# Patient Record
Sex: Male | Born: 1990 | Race: White | Hispanic: No | Marital: Single | Smoking: Former smoker
Health system: Southern US, Community
[De-identification: ages and names within clinical notes are randomized; demographics above are authoritative.]

## PROBLEM LIST (undated history)

## (undated) DIAGNOSIS — R45851 Suicidal ideations: Secondary | ICD-10-CM

## (undated) DIAGNOSIS — F909 Attention-deficit hyperactivity disorder, unspecified type: Secondary | ICD-10-CM

## (undated) DIAGNOSIS — T50901A Poisoning by unspecified drugs, medicaments and biological substances, accidental (unintentional), initial encounter: Secondary | ICD-10-CM

## (undated) DIAGNOSIS — K589 Irritable bowel syndrome without diarrhea: Secondary | ICD-10-CM

## (undated) DIAGNOSIS — F419 Anxiety disorder, unspecified: Secondary | ICD-10-CM

## (undated) DIAGNOSIS — K219 Gastro-esophageal reflux disease without esophagitis: Secondary | ICD-10-CM

## (undated) DIAGNOSIS — M1990 Unspecified osteoarthritis, unspecified site: Secondary | ICD-10-CM

## (undated) DIAGNOSIS — F319 Bipolar disorder, unspecified: Secondary | ICD-10-CM

## (undated) DIAGNOSIS — F4310 Post-traumatic stress disorder, unspecified: Secondary | ICD-10-CM

## (undated) HISTORY — PX: APPENDECTOMY: SHX54

---
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
## 2010-01-31 ENCOUNTER — Emergency Department: Payer: Self-pay | Admitting: Emergency Medicine

## 2010-02-12 ENCOUNTER — Other Ambulatory Visit: Payer: Self-pay | Admitting: Emergency Medicine

## 2010-02-12 ENCOUNTER — Ambulatory Visit: Payer: Self-pay | Admitting: Psychiatry

## 2010-02-12 ENCOUNTER — Inpatient Hospital Stay (HOSPITAL_COMMUNITY): Admission: RE | Admit: 2010-02-12 | Discharge: 2010-02-19 | Payer: Self-pay | Admitting: Psychiatry

## 2010-02-23 ENCOUNTER — Emergency Department: Payer: Self-pay | Admitting: Emergency Medicine

## 2010-02-28 ENCOUNTER — Emergency Department: Payer: Self-pay | Admitting: Emergency Medicine

## 2010-03-07 ENCOUNTER — Emergency Department (HOSPITAL_COMMUNITY): Admission: EM | Admit: 2010-03-07 | Discharge: 2010-03-07 | Payer: Self-pay | Admitting: Family Medicine

## 2010-03-31 ENCOUNTER — Emergency Department (HOSPITAL_COMMUNITY): Admission: EM | Admit: 2010-03-31 | Discharge: 2010-04-01 | Payer: Self-pay | Admitting: Emergency Medicine

## 2010-04-20 ENCOUNTER — Other Ambulatory Visit: Payer: Self-pay | Admitting: Emergency Medicine

## 2010-04-21 ENCOUNTER — Inpatient Hospital Stay (HOSPITAL_COMMUNITY): Admission: RE | Admit: 2010-04-21 | Discharge: 2010-04-22 | Payer: Self-pay | Admitting: Psychiatry

## 2010-04-21 ENCOUNTER — Ambulatory Visit: Payer: Self-pay | Admitting: Psychiatry

## 2010-04-24 ENCOUNTER — Emergency Department (HOSPITAL_COMMUNITY): Admission: EM | Admit: 2010-04-24 | Discharge: 2010-04-24 | Payer: Self-pay | Admitting: Emergency Medicine

## 2010-04-24 ENCOUNTER — Emergency Department (HOSPITAL_COMMUNITY): Admission: EM | Admit: 2010-04-24 | Discharge: 2010-04-24 | Payer: Self-pay | Admitting: Family Medicine

## 2010-05-14 ENCOUNTER — Emergency Department (HOSPITAL_COMMUNITY): Admission: EM | Admit: 2010-05-14 | Discharge: 2010-05-14 | Payer: Self-pay | Admitting: Family Medicine

## 2010-05-24 ENCOUNTER — Emergency Department (HOSPITAL_BASED_OUTPATIENT_CLINIC_OR_DEPARTMENT_OTHER): Admission: EM | Admit: 2010-05-24 | Discharge: 2010-05-24 | Payer: Self-pay | Admitting: Emergency Medicine

## 2010-08-26 ENCOUNTER — Emergency Department (HOSPITAL_COMMUNITY)
Admission: EM | Admit: 2010-08-26 | Discharge: 2010-08-26 | Payer: Self-pay | Source: Home / Self Care | Admitting: Emergency Medicine

## 2010-08-26 LAB — POCT URINALYSIS DIPSTICK
Bilirubin Urine: NEGATIVE
Hemoglobin, Urine: NEGATIVE
Ketones, ur: NEGATIVE mg/dL
Nitrite: NEGATIVE
Protein, ur: NEGATIVE mg/dL
Specific Gravity, Urine: 1.015 (ref 1.005–1.030)
Urine Glucose, Fasting: NEGATIVE mg/dL
Urobilinogen, UA: 0.2 mg/dL (ref 0.0–1.0)
pH: 7 (ref 5.0–8.0)

## 2010-09-23 ENCOUNTER — Inpatient Hospital Stay (INDEPENDENT_AMBULATORY_CARE_PROVIDER_SITE_OTHER)
Admission: RE | Admit: 2010-09-23 | Discharge: 2010-09-23 | Disposition: A | Discharge status: Home / Self Care | Payer: Medicaid Other | Source: Ambulatory Visit | Attending: Emergency Medicine | Admitting: Emergency Medicine

## 2010-09-23 ENCOUNTER — Other Ambulatory Visit (HOSPITAL_COMMUNITY): Payer: Self-pay | Admitting: Emergency Medicine

## 2010-09-23 ENCOUNTER — Ambulatory Visit (HOSPITAL_COMMUNITY)
Admission: RE | Admit: 2010-09-23 | Discharge: 2010-09-23 | Disposition: A | Discharge status: Home / Self Care | Payer: Medicaid Other | Source: Ambulatory Visit | Attending: Emergency Medicine | Admitting: Emergency Medicine

## 2010-09-23 DIAGNOSIS — S99921A Unspecified injury of right foot, initial encounter: Secondary | ICD-10-CM

## 2010-09-23 DIAGNOSIS — S93609A Unspecified sprain of unspecified foot, initial encounter: Secondary | ICD-10-CM

## 2010-09-23 DIAGNOSIS — M79609 Pain in unspecified limb: Secondary | ICD-10-CM | POA: Insufficient documentation

## 2010-11-04 LAB — DIFFERENTIAL
Basophils Absolute: 0.1 10*3/uL (ref 0.0–0.1)
Basophils Absolute: 0 10*3/uL (ref 0.0–0.1)
Basophils Relative: 1 % (ref 0–1)
Basophils Relative: 0 % (ref 0–1)
Eosinophils Absolute: 0.1 10*3/uL (ref 0.0–0.7)
Eosinophils Absolute: 0 10*3/uL (ref 0.0–0.7)
Eosinophils Relative: 2 % (ref 0–5)
Eosinophils Relative: 0 % (ref 0–5)
Lymphocytes Relative: 41 % (ref 12–46)
Lymphocytes Relative: 8 % — ABNORMAL LOW (ref 12–46)
Lymphs Abs: 2.3 10*3/uL (ref 0.7–4.0)
Lymphs Abs: 0.8 10*3/uL (ref 0.7–4.0)
Monocytes Absolute: 0.5 10*3/uL (ref 0.1–1.0)
Monocytes Absolute: 0.7 10*3/uL (ref 0.1–1.0)
Monocytes Relative: 9 % (ref 3–12)
Monocytes Relative: 8 % (ref 3–12)
Neutro Abs: 2.7 10*3/uL (ref 1.7–7.7)
Neutro Abs: 8.1 10*3/uL — ABNORMAL HIGH (ref 1.7–7.7)
Neutrophils Relative %: 47 % (ref 43–77)
Neutrophils Relative %: 84 % — ABNORMAL HIGH (ref 43–77)

## 2010-11-04 LAB — BASIC METABOLIC PANEL
BUN: 10 mg/dL (ref 6–23)
BUN: 12 mg/dL (ref 6–23)
CO2: 30 mEq/L (ref 19–32)
CO2: 26 mEq/L (ref 19–32)
Calcium: 9.2 mg/dL (ref 8.4–10.5)
Calcium: 8.7 mg/dL (ref 8.4–10.5)
Chloride: 106 mEq/L (ref 96–112)
Chloride: 101 mEq/L (ref 96–112)
Creatinine, Ser: 0.8 mg/dL (ref 0.4–1.5)
Creatinine, Ser: 0.72 mg/dL (ref 0.4–1.5)
GFR calc Af Amer: >60 mL/min (ref >60)
GFR calc Af Amer: >60 mL/min (ref >60)
GFR calc non Af Amer: >60 mL/min (ref >60)
GFR calc non Af Amer: >60 mL/min (ref >60)
Glucose, Bld: 94 mg/dL (ref 70–99)
Glucose, Bld: 106 mg/dL — ABNORMAL HIGH (ref 70–99)
Potassium: 4.3 mEq/L (ref 3.5–5.1)
Potassium: 3.8 mEq/L (ref 3.5–5.1)
Sodium: 142 mEq/L (ref 135–145)
Sodium: 134 mEq/L — ABNORMAL LOW (ref 135–145)

## 2010-11-04 LAB — CBC
HCT: 37.2 % — ABNORMAL LOW (ref 39.0–52.0)
HCT: 36.8 % — ABNORMAL LOW (ref 39.0–52.0)
Hemoglobin: 13.1 g/dL (ref 13.0–17.0)
Hemoglobin: 13 g/dL (ref 13.0–17.0)
MCH: 29.8 pg (ref 26.0–34.0)
MCH: 30.5 pg (ref 26.0–34.0)
MCHC: 35.2 g/dL (ref 30.0–36.0)
MCHC: 35.4 g/dL (ref 30.0–36.0)
MCV: 84.7 fL (ref 78.0–100.0)
MCV: 86.1 fL (ref 78.0–100.0)
Platelets: 223 10*3/uL (ref 150–400)
Platelets: 179 10*3/uL (ref 150–400)
RBC: 4.39 MIL/uL (ref 4.22–5.81)
RBC: 4.27 MIL/uL (ref 4.22–5.81)
RDW: 13 % (ref 11.5–15.5)
RDW: 13.3 % (ref 11.5–15.5)
WBC: 5.7 10*3/uL (ref 4.0–10.5)
WBC: 9.6 10*3/uL (ref 4.0–10.5)

## 2010-11-04 LAB — POCT TOXICOLOGY PANEL: Amphetamines: POSITIVE

## 2010-11-04 LAB — VALPROIC ACID LEVEL: Valproic Acid Lvl: 37.8 ug/mL — ABNORMAL LOW (ref 50.0–100.0)

## 2010-11-04 LAB — MONONUCLEOSIS SCREEN: Mono Screen: NEGATIVE

## 2010-11-04 LAB — TSH: TSH: 4.5 u[IU]/mL (ref 0.350–4.500)

## 2010-11-04 LAB — POCT RAPID STREP A (OFFICE): Streptococcus, Group A Screen (Direct): NEGATIVE

## 2010-11-04 LAB — ETHANOL: Alcohol, Ethyl (B): <10 mg/dL (ref 0–10)

## 2010-11-05 LAB — CBC
HCT: 36.7 % — ABNORMAL LOW (ref 39.0–52.0)
HCT: 38.5 % — ABNORMAL LOW (ref 39.0–52.0)
Hemoglobin: 12.6 g/dL — ABNORMAL LOW (ref 13.0–17.0)
Hemoglobin: 13.2 g/dL (ref 13.0–17.0)
MCH: 30 pg (ref 26.0–34.0)
MCH: 30.4 pg (ref 26.0–34.0)
MCHC: 34.5 g/dL (ref 30.0–36.0)
MCHC: 34.2 g/dL (ref 30.0–36.0)
MCV: 87.1 fL (ref 78.0–100.0)
MCV: 88.7 fL (ref 78.0–100.0)
Platelets: 212 10*3/uL (ref 150–400)
Platelets: 163 10*3/uL (ref 150–400)
RBC: 4.21 MIL/uL — ABNORMAL LOW (ref 4.22–5.81)
RBC: 4.34 MIL/uL (ref 4.22–5.81)
RDW: 12.8 % (ref 11.5–15.5)
RDW: 12.3 % (ref 11.5–15.5)
WBC: 6 10*3/uL (ref 4.0–10.5)
WBC: 6 10*3/uL (ref 4.0–10.5)

## 2010-11-05 LAB — DIFFERENTIAL
Basophils Absolute: 0 10*3/uL (ref 0.0–0.1)
Basophils Absolute: 0 10*3/uL (ref 0.0–0.1)
Basophils Relative: 0 % (ref 0–1)
Basophils Relative: 0 % (ref 0–1)
Eosinophils Absolute: 0.1 10*3/uL (ref 0.0–0.7)
Eosinophils Absolute: 0.2 10*3/uL (ref 0.0–0.7)
Eosinophils Relative: 2 % (ref 0–5)
Eosinophils Relative: 4 % (ref 0–5)
Lymphocytes Relative: 44 % (ref 12–46)
Lymphocytes Relative: 36 % (ref 12–46)
Lymphs Abs: 2.6 10*3/uL (ref 0.7–4.0)
Lymphs Abs: 2.2 10*3/uL (ref 0.7–4.0)
Monocytes Absolute: 0.5 10*3/uL (ref 0.1–1.0)
Monocytes Absolute: 0.5 10*3/uL (ref 0.1–1.0)
Monocytes Relative: 8 % (ref 3–12)
Monocytes Relative: 8 % (ref 3–12)
Neutro Abs: 2.8 10*3/uL (ref 1.7–7.7)
Neutro Abs: 3.1 10*3/uL (ref 1.7–7.7)
Neutrophils Relative %: 46 % (ref 43–77)
Neutrophils Relative %: 51 % (ref 43–77)

## 2010-11-05 LAB — COMPREHENSIVE METABOLIC PANEL
ALT: 9 U/L (ref 0–53)
AST: 15 U/L (ref 0–37)
Albumin: 3.7 g/dL (ref 3.5–5.2)
Alkaline Phosphatase: 93 U/L (ref 39–117)
BUN: 13 mg/dL (ref 6–23)
CO2: 28 mEq/L (ref 19–32)
Calcium: 9.1 mg/dL (ref 8.4–10.5)
Chloride: 107 mEq/L (ref 96–112)
Creatinine, Ser: 0.6 mg/dL (ref 0.4–1.5)
GFR calc Af Amer: >60 mL/min (ref >60)
GFR calc non Af Amer: >60 mL/min (ref >60)
Glucose, Bld: 107 mg/dL — ABNORMAL HIGH (ref 70–99)
Potassium: 4 mEq/L (ref 3.5–5.1)
Sodium: 139 mEq/L (ref 135–145)
Total Bilirubin: 0.6 mg/dL (ref 0.3–1.2)
Total Protein: 6.5 g/dL (ref 6.0–8.3)

## 2010-11-05 LAB — URINALYSIS, ROUTINE W REFLEX MICROSCOPIC
Bilirubin Urine: NEGATIVE
Bilirubin Urine: NEGATIVE
Glucose, UA: NEGATIVE mg/dL
Glucose, UA: NEGATIVE mg/dL
Hgb urine dipstick: NEGATIVE
Hgb urine dipstick: NEGATIVE
Ketones, ur: NEGATIVE mg/dL
Ketones, ur: NEGATIVE mg/dL
Nitrite: NEGATIVE
Nitrite: NEGATIVE
Protein, ur: NEGATIVE mg/dL
Protein, ur: NEGATIVE mg/dL
Specific Gravity, Urine: 1.02 (ref 1.005–1.030)
Specific Gravity, Urine: 1.019 (ref 1.005–1.030)
Urobilinogen, UA: 1 mg/dL (ref 0.0–1.0)
Urobilinogen, UA: 0.2 mg/dL (ref 0.0–1.0)
pH: 7 (ref 5.0–8.0)
pH: 6.5 (ref 5.0–8.0)

## 2010-11-05 LAB — BASIC METABOLIC PANEL
BUN: 14 mg/dL (ref 6–23)
CO2: 29 mEq/L (ref 19–32)
Calcium: 9 mg/dL (ref 8.4–10.5)
Chloride: 106 mEq/L (ref 96–112)
Creatinine, Ser: 0.7 mg/dL (ref 0.4–1.5)
GFR calc Af Amer: >60 mL/min (ref >60)
GFR calc non Af Amer: >60 mL/min (ref >60)
Glucose, Bld: 96 mg/dL (ref 70–99)
Potassium: 4.2 mEq/L (ref 3.5–5.1)
Sodium: 142 mEq/L (ref 135–145)

## 2010-11-05 LAB — VALPROIC ACID LEVEL
Valproic Acid Lvl: 50.4 ug/mL (ref 50.0–100.0)
Valproic Acid Lvl: 67.6 ug/mL (ref 50.0–100.0)

## 2010-11-05 LAB — RAPID URINE DRUG SCREEN, HOSP PERFORMED
Amphetamines: NOT DETECTED
Barbiturates: NOT DETECTED
Benzodiazepines: NOT DETECTED
Cocaine: NOT DETECTED
Opiates: NOT DETECTED
Tetrahydrocannabinol: NOT DETECTED

## 2010-11-05 LAB — AMMONIA: Ammonia: 35 umol/L (ref 11–35)

## 2010-11-05 LAB — ETHANOL: Alcohol, Ethyl (B): <5 mg/dL (ref 0–10)

## 2010-11-05 LAB — ACETAMINOPHEN LEVEL: Acetaminophen (Tylenol), Serum: <10 ug/mL — ABNORMAL LOW (ref 10–30)

## 2010-11-07 LAB — DIFFERENTIAL
Basophils Absolute: 0 10*3/uL (ref 0.0–0.1)
Basophils Relative: 1 % (ref 0–1)
Eosinophils Absolute: 0.1 10*3/uL (ref 0.0–0.7)
Eosinophils Relative: 2 % (ref 0–5)
Lymphocytes Relative: 43 % (ref 12–46)
Lymphs Abs: 2.1 10*3/uL (ref 0.7–4.0)
Monocytes Absolute: 0.2 10*3/uL (ref 0.1–1.0)
Monocytes Relative: 5 % (ref 3–12)
Neutro Abs: 2.4 10*3/uL (ref 1.7–7.7)
Neutrophils Relative %: 49 % (ref 43–77)

## 2010-11-07 LAB — COMPREHENSIVE METABOLIC PANEL
ALT: 23 U/L (ref 0–53)
AST: 21 U/L (ref 0–37)
Albumin: 4.2 g/dL (ref 3.5–5.2)
Alkaline Phosphatase: 89 U/L (ref 39–117)
BUN: 14 mg/dL (ref 6–23)
CO2: 31 mEq/L (ref 19–32)
Calcium: 8.9 mg/dL (ref 8.4–10.5)
Chloride: 101 mEq/L (ref 96–112)
Creatinine, Ser: 0.69 mg/dL (ref 0.4–1.5)
GFR calc Af Amer: >60 mL/min (ref >60)
GFR calc non Af Amer: >60 mL/min (ref >60)
Glucose, Bld: 159 mg/dL — ABNORMAL HIGH (ref 70–99)
Potassium: 3.7 mEq/L (ref 3.5–5.1)
Sodium: 137 mEq/L (ref 135–145)
Total Bilirubin: 0.6 mg/dL (ref 0.3–1.2)
Total Protein: 7.1 g/dL (ref 6.0–8.3)

## 2010-11-07 LAB — CBC
HCT: 38.7 % — ABNORMAL LOW (ref 39.0–52.0)
Hemoglobin: 12.9 g/dL — ABNORMAL LOW (ref 13.0–17.0)
MCH: 31.2 pg (ref 26.0–34.0)
MCHC: 33.3 g/dL (ref 30.0–36.0)
MCV: 93.6 fL (ref 78.0–100.0)
Platelets: 239 10*3/uL (ref 150–400)
RBC: 4.14 MIL/uL — ABNORMAL LOW (ref 4.22–5.81)
RDW: 11.6 % (ref 11.5–15.5)
WBC: 4.8 10*3/uL (ref 4.0–10.5)

## 2010-11-07 LAB — RPR: RPR Ser Ql: NONREACTIVE

## 2010-11-07 LAB — VALPROIC ACID LEVEL: Valproic Acid Lvl: 58.4 ug/mL (ref 50.0–100.0)

## 2010-11-07 LAB — CARBAMAZEPINE LEVEL, TOTAL: Carbamazepine Lvl: 6.5 ug/mL (ref 4.0–12.0)

## 2010-11-07 LAB — URINALYSIS, ROUTINE W REFLEX MICROSCOPIC
Bilirubin Urine: NEGATIVE
Glucose, UA: NEGATIVE mg/dL
Hgb urine dipstick: NEGATIVE
Ketones, ur: NEGATIVE mg/dL
Nitrite: NEGATIVE
Protein, ur: NEGATIVE mg/dL
Specific Gravity, Urine: 1.013 (ref 1.005–1.030)
Urobilinogen, UA: 0.2 mg/dL (ref 0.0–1.0)
pH: 6.5 (ref 5.0–8.0)

## 2010-11-07 LAB — RAPID URINE DRUG SCREEN, HOSP PERFORMED
Amphetamines: NOT DETECTED
Barbiturates: NOT DETECTED
Benzodiazepines: NOT DETECTED
Cocaine: NOT DETECTED
Opiates: NOT DETECTED
Tetrahydrocannabinol: NOT DETECTED

## 2010-11-07 LAB — HIV ANTIBODY (ROUTINE TESTING W REFLEX): HIV: NONREACTIVE

## 2010-11-07 LAB — ETHANOL: Alcohol, Ethyl (B): <5 mg/dL (ref 0–10)

## 2010-12-02 LAB — GC/CHLAMYDIA PROBE AMP, URINE
Chlamydia, Swab/Urine, PCR: NEGATIVE
GC Probe Amp, Urine: NEGATIVE

## 2010-12-02 LAB — DRUGS OF ABUSE SCREEN W/O ALC, ROUTINE URINE
Amphetamine Screen, Ur: POSITIVE — AB
Barbiturate Quant, Ur: NEGATIVE
Benzodiazepines.: NEGATIVE
Cocaine Metabolites: NEGATIVE
Creatinine,U: 158.3 mg/dL
Marijuana Metabolite: NEGATIVE
Methadone: NEGATIVE
Opiate Screen, Urine: NEGATIVE
Phencyclidine (PCP): NEGATIVE
Propoxyphene: NEGATIVE

## 2010-12-02 LAB — CBC
HCT: 43.1 % (ref 36.0–49.0)
Hemoglobin: 14.5 g/dL (ref 12.0–16.0)
MCHC: 33.7 g/dL (ref 31.0–37.0)
MCV: 91 fL (ref 78.0–98.0)
Platelets: 175 10*3/uL (ref 150–400)
RBC: 4.74 MIL/uL (ref 3.80–5.70)
RDW: 12.6 % (ref 11.4–15.5)
WBC: 6.3 10*3/uL (ref 4.5–13.5)

## 2010-12-02 LAB — THYROID ANTIBODIES
Thyroglobulin Ab: <30 U/mL (ref 0.0–60.0)
Thyroperoxidase Ab SerPl-aCnc: 35.2 U/mL (ref 0.0–60.0)

## 2010-12-02 LAB — URINALYSIS, ROUTINE W REFLEX MICROSCOPIC
Bilirubin Urine: NEGATIVE
Glucose, UA: NEGATIVE mg/dL
Hgb urine dipstick: NEGATIVE
Ketones, ur: NEGATIVE mg/dL
Nitrite: NEGATIVE
Protein, ur: NEGATIVE mg/dL
Specific Gravity, Urine: 1.022 (ref 1.005–1.030)
Urobilinogen, UA: 1 mg/dL (ref 0.0–1.0)
pH: 6.5 (ref 5.0–8.0)

## 2010-12-02 LAB — DIFFERENTIAL
Basophils Absolute: 0 10*3/uL (ref 0.0–0.1)
Basophils Relative: 0 % (ref 0–1)
Eosinophils Absolute: 0.1 10*3/uL (ref 0.0–1.2)
Eosinophils Relative: 2 % (ref 0–5)
Lymphocytes Relative: 43 % (ref 24–48)
Lymphs Abs: 2.7 10*3/uL (ref 1.1–4.8)
Monocytes Absolute: 0.5 10*3/uL (ref 0.2–1.2)
Monocytes Relative: 7 % (ref 3–11)
Neutro Abs: 3 10*3/uL (ref 1.7–8.0)
Neutrophils Relative %: 47 % (ref 43–71)

## 2010-12-02 LAB — LIPID PANEL
Cholesterol: 123 mg/dL (ref 0–169)
HDL: 34 mg/dL — ABNORMAL LOW (ref >34)
LDL Cholesterol: 77 mg/dL (ref 0–109)
Total CHOL/HDL Ratio: 3.6 RATIO
Triglycerides: 58 mg/dL (ref <150)
VLDL: 12 mg/dL (ref 0–40)

## 2010-12-02 LAB — AMPHETAMINES URINE CONFIRMATION
Amphetamines: 6600 ng/mL
Methamphetamine GC/MS, Ur: NEGATIVE
Methylenedioxyamphetamine: NEGATIVE
Methylenedioxyethylamphetamine: NEGATIVE
Methylenedioxymethamphetamine: NEGATIVE

## 2010-12-02 LAB — BASIC METABOLIC PANEL
BUN: 13 mg/dL (ref 6–23)
CO2: 30 mEq/L (ref 19–32)
Calcium: 8.7 mg/dL (ref 8.4–10.5)
Chloride: 99 mEq/L (ref 96–112)
Creatinine, Ser: 0.76 mg/dL (ref 0.4–1.5)
Glucose, Bld: 94 mg/dL (ref 70–99)
Potassium: 4.1 mEq/L (ref 3.5–5.1)
Sodium: 141 mEq/L (ref 135–145)

## 2010-12-02 LAB — HEMOGLOBIN A1C
Hgb A1c MFr Bld: 5.1 % (ref 4.6–6.1)
Mean Plasma Glucose: 100 mg/dL

## 2010-12-02 LAB — HEPATIC FUNCTION PANEL
ALT: 12 U/L (ref 0–53)
AST: 15 U/L (ref 0–37)
Albumin: 3.5 g/dL (ref 3.5–5.2)
Alkaline Phosphatase: 118 U/L (ref 52–171)
Bilirubin, Direct: 0.1 mg/dL (ref 0.0–0.3)
Indirect Bilirubin: 0.5 mg/dL (ref 0.3–0.9)
Total Bilirubin: 0.6 mg/dL (ref 0.3–1.2)
Total Protein: 6.5 g/dL (ref 6.0–8.3)

## 2010-12-02 LAB — VALPROIC ACID LEVEL: Valproic Acid Lvl: 102.3 ug/mL — ABNORMAL HIGH (ref 50.0–100.0)

## 2010-12-02 LAB — GAMMA GT: GGT: 16 U/L (ref 7–51)

## 2010-12-02 LAB — T4, FREE: Free T4: 0.89 ng/dL (ref 0.89–1.80)

## 2010-12-02 LAB — T3, FREE: T3, Free: 3.5 pg/mL (ref 2.3–4.2)

## 2010-12-02 LAB — TSH: TSH: 6.667 u[IU]/mL — ABNORMAL HIGH (ref 0.350–4.500)

## 2010-12-02 LAB — RPR: RPR Ser Ql: NONREACTIVE

## 2010-12-16 ENCOUNTER — Inpatient Hospital Stay (INDEPENDENT_AMBULATORY_CARE_PROVIDER_SITE_OTHER)
Admission: RE | Admit: 2010-12-16 | Discharge: 2010-12-16 | Disposition: A | Discharge status: Home / Self Care | Payer: Medicaid Other | Source: Ambulatory Visit | Attending: Family Medicine | Admitting: Family Medicine

## 2010-12-16 ENCOUNTER — Ambulatory Visit (INDEPENDENT_AMBULATORY_CARE_PROVIDER_SITE_OTHER): Payer: Medicaid Other

## 2010-12-16 DIAGNOSIS — S5010XA Contusion of unspecified forearm, initial encounter: Secondary | ICD-10-CM
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
## 2011-01-11 ENCOUNTER — Inpatient Hospital Stay (INDEPENDENT_AMBULATORY_CARE_PROVIDER_SITE_OTHER)
Admission: RE | Admit: 2011-01-11 | Discharge: 2011-01-11 | Disposition: A | Discharge status: Home / Self Care | Payer: Self-pay | Source: Ambulatory Visit | Attending: Family Medicine | Admitting: Family Medicine

## 2011-01-11 DIAGNOSIS — L723 Sebaceous cyst: Secondary | ICD-10-CM

## 2011-05-16 LAB — URINALYSIS, ROUTINE W REFLEX MICROSCOPIC
Bilirubin Urine: NEGATIVE
Glucose, UA: NEGATIVE
Hgb urine dipstick: NEGATIVE
Ketones, ur: NEGATIVE
Nitrite: NEGATIVE
Protein, ur: NEGATIVE
Specific Gravity, Urine: 1.025
Urobilinogen, UA: 0.2
pH: 7

## 2011-05-16 LAB — DRUGS OF ABUSE SCREEN W/O ALC, ROUTINE URINE
Amphetamine Screen, Ur: POSITIVE — AB
Barbiturate Quant, Ur: NEGATIVE
Benzodiazepines.: NEGATIVE
Cocaine Metabolites: NEGATIVE
Creatinine,U: 117.8
Marijuana Metabolite: NEGATIVE
Methadone: NEGATIVE
Opiate Screen, Urine: NEGATIVE
Phencyclidine (PCP): NEGATIVE
Propoxyphene: NEGATIVE

## 2011-05-16 LAB — CBC
HCT: 38.5
Hemoglobin: 13.6
MCHC: 35.5
MCV: 81.8
Platelets: 251
RBC: 4.7
RDW: 14.1
WBC: 6

## 2011-05-16 LAB — VALPROIC ACID LEVEL: Valproic Acid Lvl: 64.8

## 2011-05-16 LAB — DIFFERENTIAL
Basophils Absolute: 0
Basophils Relative: 0
Eosinophils Absolute: 0.2
Eosinophils Relative: 3
Lymphocytes Relative: 40
Lymphs Abs: 2.4
Monocytes Absolute: 0.4
Monocytes Relative: 7
Neutro Abs: 3
Neutrophils Relative %: 50

## 2011-05-16 LAB — HEPATIC FUNCTION PANEL
ALT: 19
AST: 21
Albumin: 3.5
Alkaline Phosphatase: 220 — ABNORMAL HIGH
Bilirubin, Direct: 0.1
Indirect Bilirubin: 0.6
Total Bilirubin: 0.7
Total Protein: 6.3

## 2011-05-16 LAB — LIPID PANEL
Cholesterol: 124
HDL: 31 — ABNORMAL LOW
LDL Cholesterol: 74
Total CHOL/HDL Ratio: 4
Triglycerides: 94
VLDL: 19

## 2011-05-16 LAB — BASIC METABOLIC PANEL
BUN: 11
CO2: 32
Calcium: 9
Chloride: 102
Creatinine, Ser: 0.76
Glucose, Bld: 96
Potassium: 4.4
Sodium: 140

## 2011-05-16 LAB — GAMMA GT: GGT: 26

## 2011-05-16 LAB — AMPHETAMINES URINE CONFIRMATION
Amphetamines: 6300 ng/mL
Methamphetamine GC/MS, Ur: NEGATIVE
Methylenedioxyamphetamine: NEGATIVE
Methylenedioxyethylamphetamine: NEGATIVE
Methylenedioxymethamphetamine: NEGATIVE

## 2011-05-16 LAB — TSH: TSH: 2.49

## 2011-05-16 LAB — T4, FREE: Free T4: 1.26

## 2011-05-16 LAB — HEMOGLOBIN A1C
Hgb A1c MFr Bld: 5.5
Mean Plasma Glucose: 119

## 2011-05-16 LAB — GC/CHLAMYDIA PROBE AMP, URINE
Chlamydia, Swab/Urine, PCR: NEGATIVE
GC Probe Amp, Urine: NEGATIVE

## 2011-05-18 LAB — OCCULT BLOOD X 1 CARD TO LAB, STOOL: Fecal Occult Bld: POSITIVE

## 2011-05-20 LAB — AMPHETAMINES URINE CONFIRMATION
Amphetamines: 4000 ng/mL
Methamphetamine GC/MS, Ur: NEGATIVE
Methylenedioxyamphetamine: NEGATIVE
Methylenedioxyethylamphetamine: NEGATIVE
Methylenedioxymethamphetamine: NEGATIVE

## 2011-05-20 LAB — DRUGS OF ABUSE SCREEN W/O ALC, ROUTINE URINE
Amphetamine Screen, Ur: POSITIVE — AB
Barbiturate Quant, Ur: NEGATIVE
Benzodiazepines.: NEGATIVE
Cocaine Metabolites: NEGATIVE
Creatinine,U: 194.5
Marijuana Metabolite: NEGATIVE
Methadone: NEGATIVE
Opiate Screen, Urine: NEGATIVE
Phencyclidine (PCP): NEGATIVE
Propoxyphene: NEGATIVE

## 2011-05-20 LAB — LIPID PANEL
Cholesterol: 115
HDL: 26 — ABNORMAL LOW
LDL Cholesterol: 48
Total CHOL/HDL Ratio: 4.4
Triglycerides: 203 — ABNORMAL HIGH
VLDL: 41 — ABNORMAL HIGH

## 2011-05-20 LAB — DIFFERENTIAL
Basophils Absolute: 0
Basophils Relative: 0
Eosinophils Absolute: 0.1
Eosinophils Relative: 2
Lymphocytes Relative: 39
Lymphs Abs: 2.4
Monocytes Absolute: 0.5
Monocytes Relative: 8
Neutro Abs: 3.1
Neutrophils Relative %: 51

## 2011-05-20 LAB — CBC
HCT: 39.1
Hemoglobin: 13.3
MCHC: 34.1
MCV: 83.4
Platelets: 211
RBC: 4.69
RDW: 13.8
WBC: 6.1

## 2011-05-20 LAB — COMPREHENSIVE METABOLIC PANEL
ALT: 14
AST: 16
Albumin: 3.3 — ABNORMAL LOW
Alkaline Phosphatase: 159
BUN: 9
CO2: 32
Calcium: 9
Chloride: 104
Creatinine, Ser: 0.74
Glucose, Bld: 100 — ABNORMAL HIGH
Potassium: 4.4
Sodium: 143
Total Bilirubin: 0.5
Total Protein: 5.9 — ABNORMAL LOW

## 2011-05-20 LAB — URINALYSIS, ROUTINE W REFLEX MICROSCOPIC
Bilirubin Urine: NEGATIVE
Glucose, UA: NEGATIVE
Hgb urine dipstick: NEGATIVE
Ketones, ur: NEGATIVE
Nitrite: NEGATIVE
Protein, ur: NEGATIVE
Specific Gravity, Urine: 1.027
Urobilinogen, UA: 0.2
pH: 7

## 2011-05-20 LAB — TSH: TSH: 2.746

## 2011-05-20 LAB — VALPROIC ACID LEVEL: Valproic Acid Lvl: 62.7

## 2011-05-20 LAB — HEMOGLOBIN A1C
Hgb A1c MFr Bld: 5.5
Mean Plasma Glucose: 119

## 2011-05-23 LAB — HIV ANTIBODY (ROUTINE TESTING W REFLEX): HIV: NONREACTIVE

## 2011-05-23 LAB — RPR: RPR Ser Ql: NONREACTIVE

## 2011-09-02 ENCOUNTER — Emergency Department (HOSPITAL_COMMUNITY)
Admission: EM | Admit: 2011-09-02 | Discharge: 2011-09-02 | Disposition: A | Discharge status: Home / Self Care | Payer: Medicaid Other | Attending: Emergency Medicine | Admitting: Emergency Medicine

## 2011-09-02 DIAGNOSIS — R51 Headache: Secondary | ICD-10-CM | POA: Insufficient documentation

## 2011-09-02 DIAGNOSIS — J069 Acute upper respiratory infection, unspecified: Secondary | ICD-10-CM | POA: Insufficient documentation

## 2011-09-02 DIAGNOSIS — R059 Cough, unspecified: Secondary | ICD-10-CM | POA: Insufficient documentation

## 2011-09-02 DIAGNOSIS — R05 Cough: Secondary | ICD-10-CM | POA: Insufficient documentation

## 2011-09-02 DIAGNOSIS — H6590 Unspecified nonsuppurative otitis media, unspecified ear: Secondary | ICD-10-CM | POA: Insufficient documentation

## 2011-09-02 DIAGNOSIS — R42 Dizziness and giddiness: Secondary | ICD-10-CM | POA: Insufficient documentation

## 2011-09-02 MED ORDER — ACETAMINOPHEN 325 MG PO TABS
650.0000 mg | ORAL_TABLET | Freq: Once | ORAL | Status: AC
  Administered 2011-09-02: 650 mg via ORAL
  Filled 2011-09-02: qty 2

## 2011-09-02 NOTE — ED Provider Notes (Signed)
History     CSN: [id number]  Arrival date & time 09/02/11  1746   First MD Initiated Contact with Patient 09/02/11 1919      Chief Complaint  Patient presents with  . Headache    x 1 month, motrin 800mg  will help for a few hours.   . Otalgia    left side x 1 week.  no drainage  . Dizziness    since yesterday    (Consider location/radiation/quality/duration/timing/severity/associated sxs/prior treatment) Patient is a 21 y.o. male presenting with headaches and ear pain. The history is provided by the patient.  Headache  This is a new problem. The current episode started more than 1 week ago. Episode frequency: every few days. The problem has not changed since onset.The headache is associated with nothing. The pain is located in the frontal region. The quality of the pain is described as dull. The pain is moderate. The pain does not radiate. Pertinent negatives include no fever, no syncope, no shortness of breath, no nausea and no vomiting. He has tried NSAIDs for the symptoms. The treatment provided moderate relief.  Otalgia This is a new problem. The current episode started more than 2 days ago. There is pain in the left ear. The problem occurs constantly. The problem has been gradually worsening. There has been no fever. The pain is moderate. Associated symptoms include headaches, sore throat and cough. Pertinent negatives include no ear discharge, no hearing loss, no rhinorrhea, no abdominal pain, no vomiting, no neck pain and no rash.  Bilateral ear "fullness" with pain to left ear. Also with intermittent dizziness feeling "foggy" x 1 day assoc with ear fullness. Pt reports he has a prescription for flonase from PCP but has not been able to afford it recently.  No past medical history on file.  No past surgical history on file.  No family history on file.  History  Substance Use Topics  . Smoking status: Not on file  . Smokeless tobacco: Not on file  . Alcohol Use: Not on file        Review of Systems  Constitutional: Positive for chills. Negative for fever, appetite change and fatigue.  HENT: Positive for ear pain, congestion, sore throat and sinus pressure. Negative for hearing loss, rhinorrhea, trouble swallowing, neck pain, neck stiffness and ear discharge.   Eyes: Negative for pain, discharge and visual disturbance.  Respiratory: Positive for cough. Negative for chest tightness and shortness of breath.   Cardiovascular: Negative for chest pain and syncope.  Gastrointestinal: Negative for nausea, vomiting and abdominal pain.  Genitourinary: Negative for dysuria and hematuria.  Musculoskeletal: Negative for back pain, joint swelling and gait problem.  Skin: Negative for rash and wound.  Neurological: Positive for dizziness and headaches. Negative for syncope, speech difficulty, weakness, light-headedness and numbness.  Psychiatric/Behavioral: Negative for confusion.    Allergies  Geodon  Home Medications   Current Outpatient Rx  Name Route Sig Dispense Refill  . BUPROPION HCL ER (XL) 150 MG PO TB24 Oral Take 150 mg by mouth every morning.    Marland Kitchen DIVALPROEX SODIUM ER 500 MG PO TB24 Oral Take 1,000 mg by mouth 2 (two) times daily. Pt takes 1000 mg in the morning,1000 mg at bedtime    . GUANFACINE HCL ER 2 MG PO TB24 Oral Take 2 mg by mouth every morning.    . IBUPROFEN 800 MG PO TABS Oral Take 800 mg by mouth 3 (three) times daily as needed. pain    . LEVOTHYROXINE  SODIUM 50 MCG PO TABS Oral Take 50 mcg by mouth daily.    Marland Kitchen LISDEXAMFETAMINE DIMESYLATE 50 MG PO CAPS Oral Take 50 mg by mouth every morning.    Marland Kitchen MINOCYCLINE HCL 100 MG PO CAPS Oral Take 100 mg by mouth 2 (two) times daily. Takes cont.    . QUETIAPINE FUMARATE ER 150 MG PO TB24 Oral Take 150 mg by mouth See admin instructions. Pt takes on weekends.    Marland Kitchen RANITIDINE HCL 150 MG PO TABS Oral Take 150 mg by mouth daily.    Marland Kitchen RISPERIDONE MICROSPHERES 37.5 MG IM SUSR Intramuscular Inject 37.5 mg into  the muscle every 14 (fourteen) days. Pt get this injection at [facility]. Pt's last injection was on 09-02-11      BP 119/74  Pulse 100  Temp(Src) 98.1 F (36.7 C) (Oral)  Resp 20  SpO2 97%  Physical Exam  Constitutional: He is oriented to person, place, and time. He appears well-developed and well-nourished. No distress.  HENT:  Head: Normocephalic and atraumatic.  Right Ear: Hearing, external ear and ear canal normal. No drainage or swelling. No mastoid tenderness. Tympanic membrane is not injected, not perforated, not erythematous and not bulging.  Left Ear: External ear and ear canal normal. No drainage or swelling. No mastoid tenderness. Tympanic membrane is not injected, not perforated, not erythematous and not bulging. A middle ear effusion is present.  Nose: Nose normal.  Mouth/Throat: Uvula is midline and mucous membranes are normal. Posterior oropharyngeal erythema present. No oropharyngeal exudate or posterior oropharyngeal edema.       Hearing intact to spoken voice. Left sided decreased hearing to finger rub.  Eyes: Conjunctivae and EOM are normal. Pupils are equal, round, and reactive to light. Right eye exhibits no discharge. Left eye exhibits no discharge.  Neck: Normal range of motion. Neck supple.  Cardiovascular: Normal rate, regular rhythm and normal heart sounds.   Pulmonary/Chest: Effort normal and breath sounds normal. No respiratory distress. He has no wheezes. He exhibits no tenderness.  Abdominal: Soft. Bowel sounds are normal. He exhibits no distension. There is no tenderness.  Musculoskeletal: Normal range of motion. He exhibits no edema and no tenderness.  Lymphadenopathy:    He has no cervical adenopathy.  Neurological: He is alert and oriented to person, place, and time. No cranial nerve deficit. Coordination normal.       Gait normal. F-N itnact bilaterally. Sensation intact to light touch, GCS 15.  Skin: Skin is warm and dry. No rash noted.    Psychiatric: His behavior is normal.    ED Course  Procedures (including critical care time)  Labs Reviewed - No data to display No results found.      MDM   1: URI 2: Left ear effusion  Advised use of anti-histamine and decongestant for symptoms, which will likely improve ear fullness and effusion, as well as HA.       Elwyn Reach [place], [place] 09/02/11 2014

## 2011-09-03 NOTE — ED Provider Notes (Signed)
Medical screening examination/treatment/procedure(s) were performed by non-physician practitioner and as supervising physician I was immediately available for consultation/collaboration.  Doug Sou, MD 09/03/11 [phone]

## 2012-01-07 ENCOUNTER — Emergency Department (HOSPITAL_COMMUNITY)
Admission: EM | Admit: 2012-01-07 | Discharge: 2012-01-08 | Disposition: A | Discharge status: Home / Self Care | Payer: Medicaid Other | Attending: Emergency Medicine | Admitting: Emergency Medicine

## 2012-01-07 ENCOUNTER — Encounter (HOSPITAL_COMMUNITY): Payer: Self-pay | Admitting: *Deleted

## 2012-01-07 DIAGNOSIS — F319 Bipolar disorder, unspecified: Secondary | ICD-10-CM | POA: Insufficient documentation

## 2012-01-07 DIAGNOSIS — R002 Palpitations: Secondary | ICD-10-CM | POA: Insufficient documentation

## 2012-01-07 DIAGNOSIS — T50905A Adverse effect of unspecified drugs, medicaments and biological substances, initial encounter: Secondary | ICD-10-CM

## 2012-01-07 DIAGNOSIS — J45909 Unspecified asthma, uncomplicated: Secondary | ICD-10-CM | POA: Insufficient documentation

## 2012-01-07 DIAGNOSIS — R42 Dizziness and giddiness: Secondary | ICD-10-CM | POA: Insufficient documentation

## 2012-01-07 DIAGNOSIS — Z79899 Other long term (current) drug therapy: Secondary | ICD-10-CM | POA: Insufficient documentation

## 2012-01-07 DIAGNOSIS — R110 Nausea: Secondary | ICD-10-CM | POA: Insufficient documentation

## 2012-01-07 HISTORY — DX: Bipolar disorder, unspecified: F31.9

## 2012-01-07 NOTE — ED Notes (Signed)
Patient took herbal male enhancement about an hour ago and is now having "reaction to it."  Patient is not having any physical reaction just emotional.

## 2012-01-08 LAB — COMPREHENSIVE METABOLIC PANEL
ALT: 34 U/L (ref 0–53)
AST: 29 U/L (ref 0–37)
Albumin: 4 g/dL (ref 3.5–5.2)
Alkaline Phosphatase: 123 U/L — ABNORMAL HIGH (ref 39–117)
BUN: 16 mg/dL (ref 6–23)
CO2: 26 mEq/L (ref 19–32)
Calcium: 9.6 mg/dL (ref 8.4–10.5)
Chloride: 99 mEq/L (ref 96–112)
Creatinine, Ser: 0.77 mg/dL (ref 0.50–1.35)
GFR calc Af Amer: >90 mL/min (ref >90)
GFR calc non Af Amer: >90 mL/min (ref >90)
Glucose, Bld: 147 mg/dL — ABNORMAL HIGH (ref 70–99)
Potassium: 3.9 mEq/L (ref 3.5–5.1)
Sodium: 138 mEq/L (ref 135–145)
Total Bilirubin: 0.2 mg/dL — ABNORMAL LOW (ref 0.3–1.2)
Total Protein: 7.1 g/dL (ref 6.0–8.3)

## 2012-01-08 LAB — DIFFERENTIAL
Basophils Absolute: 0 10*3/uL (ref 0.0–0.1)
Basophils Relative: 0 % (ref 0–1)
Eosinophils Absolute: 0.1 10*3/uL (ref 0.0–0.7)
Eosinophils Relative: 0 % (ref 0–5)
Lymphocytes Relative: 13 % (ref 12–46)
Lymphs Abs: 1.8 10*3/uL (ref 0.7–4.0)
Monocytes Absolute: 1.4 10*3/uL — ABNORMAL HIGH (ref 0.1–1.0)
Monocytes Relative: 10 % (ref 3–12)
Neutro Abs: 10.4 10*3/uL — ABNORMAL HIGH (ref 1.7–7.7)
Neutrophils Relative %: 76 % (ref 43–77)

## 2012-01-08 LAB — CBC
HCT: 37.7 % — ABNORMAL LOW (ref 39.0–52.0)
Hemoglobin: 13.3 g/dL (ref 13.0–17.0)
MCH: 30 pg (ref 26.0–34.0)
MCHC: 35.3 g/dL (ref 30.0–36.0)
MCV: 85.1 fL (ref 78.0–100.0)
Platelets: 213 10*3/uL (ref 150–400)
RBC: 4.43 MIL/uL (ref 4.22–5.81)
RDW: 13.1 % (ref 11.5–15.5)
WBC: 13.7 10*3/uL — ABNORMAL HIGH (ref 4.0–10.5)

## 2012-01-08 MED ORDER — LORAZEPAM 2 MG/ML IJ SOLN
1.0000 mg | Freq: Once | INTRAMUSCULAR | Status: AC
  Administered 2012-01-08: 1 mg via INTRAVENOUS
  Filled 2012-01-08: qty 1

## 2012-01-08 NOTE — ED Notes (Signed)
Pt given urinal.

## 2012-01-08 NOTE — ED Provider Notes (Addendum)
History     CSN: [id number]  Arrival date & time 01/07/12  2255   First MD Initiated Contact with Patient 01/08/12 0028      Chief Complaint  Patient presents with  . Medication Reaction    (Consider location/radiation/quality/duration/timing/severity/associated sxs/prior treatment) HPI Comments: Took "male enhancement" pills that he bought at a gas station along with two energy drinks.  Started to feel "weird and dizzy".  Denies pain or discomfort.    Patient is a 21 y.o. male presenting with allergic reaction. The history is provided by the patient.  Allergic Reaction The primary symptoms are  nausea, dizziness and palpitations. The primary symptoms do not include wheezing, shortness of breath or vomiting. The current episode started 1 to 2 hours ago. The problem has been gradually improving. This is a new problem.  Dizziness also occurs with nausea. Dizziness does not occur with vomiting.  The palpitations also occurred with dizziness. The palpitations did not occur with shortness of breath.     Past Medical History  Diagnosis Date  . Bipolar 1 disorder   . Asthma     History reviewed. No pertinent past surgical history.  No family history on file.  History  Substance Use Topics  . Smoking status: Not on file  . Smokeless tobacco: Not on file  . Alcohol Use:       Review of Systems  Respiratory: Negative for shortness of breath and wheezing.   Cardiovascular: Positive for palpitations.  Gastrointestinal: Positive for nausea. Negative for vomiting.  Neurological: Positive for dizziness.  All other systems reviewed and are negative.    Allergies  Geodon and Haldol  Home Medications   Current Outpatient Rx  Name Route Sig Dispense Refill  . ALBUTEROL SULFATE HFA 108 (90 BASE) MCG/ACT IN AERS Inhalation Inhale 2 puffs into the lungs every 6 (six) hours as needed. For wheezing    . BUPROPION HCL ER (XL) 150 MG PO TB24 Oral Take 150 mg by mouth every morning.     Marland Kitchen CITALOPRAM HYDROBROMIDE 10 MG PO TABS Oral Take 10 mg by mouth daily.    Marland Kitchen DIVALPROEX SODIUM ER 500 MG PO TB24 Oral Take 1,000 mg by mouth 2 (two) times daily. Pt takes 1000 mg in the morning,1000 mg at bedtime    . GUANFACINE HCL ER 2 MG PO TB24 Oral Take 2 mg by mouth every morning.    Marland Kitchen HYDROXYZINE PAMOATE 50 MG PO CAPS Oral Take 50 mg by mouth at bedtime.    . IBUPROFEN 800 MG PO TABS Oral Take 800 mg by mouth 3 (three) times daily as needed. pain    . LEVOTHYROXINE SODIUM 50 MCG PO TABS Oral Take 50 mcg by mouth daily.    Marland Kitchen LISDEXAMFETAMINE DIMESYLATE 50 MG PO CAPS Oral Take 50 mg by mouth every morning.    Marland Kitchen LORATADINE 10 MG PO TABS Oral Take 10 mg by mouth daily.    Marland Kitchen MINOCYCLINE HCL 100 MG PO CAPS Oral Take 100 mg by mouth daily. Takes cont.    . QUETIAPINE FUMARATE ER 300 MG PO TB24 Oral Take 300 mg by mouth at bedtime.    Marland Kitchen RANITIDINE HCL 150 MG PO TABS Oral Take 150 mg by mouth daily.    Marland Kitchen RISPERIDONE MICROSPHERES 37.5 MG IM SUSR Intramuscular Inject 37.5 mg into the muscle every 14 (fourteen) days. Pt get this injection at [facility]. Due on 01/13/12      BP 147/63  Pulse 141  Temp  98.3 F (36.8 C)  Resp 20  SpO2 99%  Physical Exam  Nursing note and vitals reviewed. Constitutional: He is oriented to person, place, and time. He appears well-developed and well-nourished.       Appears anxious  HENT:  Head: Normocephalic and atraumatic.  Mouth/Throat: Oropharynx is clear and moist.  Eyes: EOM are normal. Pupils are equal, round, and reactive to light.  Neck: Normal range of motion.  Cardiovascular: Normal rate and regular rhythm.   No murmur heard. Pulmonary/Chest: Effort normal and breath sounds normal. No respiratory distress.  Abdominal: Soft. Bowel sounds are normal. He exhibits no distension. There is no tenderness.  Musculoskeletal: Normal range of motion. He exhibits no edema.  Neurological: He is alert and oriented to person, place, and time. No  cranial nerve deficit. Coordination normal.  Skin: Skin is warm and dry.    ED Course  Procedures (including critical care time)   Labs Reviewed  CBC  DIFFERENTIAL  COMPREHENSIVE METABOLIC PANEL   No results found.   No diagnosis found.    MDM  The patient was given ativan for his anxiety and is feeling somewhat better.  The labs look okay.  Will discharge to home.          Geoffery Lyons, MD 01/08/12 6213  Geoffery Lyons, MD 01/08/12 [phone]

## 2012-05-04 ENCOUNTER — Emergency Department (HOSPITAL_COMMUNITY): Payer: Medicaid Other

## 2012-05-04 ENCOUNTER — Emergency Department (HOSPITAL_COMMUNITY)
Admission: EM | Admit: 2012-05-04 | Discharge: 2012-05-04 | Disposition: A | Discharge status: Home / Self Care | Payer: Medicaid Other | Attending: Emergency Medicine | Admitting: Emergency Medicine

## 2012-05-04 ENCOUNTER — Encounter (HOSPITAL_COMMUNITY): Payer: Self-pay | Admitting: *Deleted

## 2012-05-04 DIAGNOSIS — R109 Unspecified abdominal pain: Secondary | ICD-10-CM

## 2012-05-04 DIAGNOSIS — J45909 Unspecified asthma, uncomplicated: Secondary | ICD-10-CM | POA: Insufficient documentation

## 2012-05-04 DIAGNOSIS — F319 Bipolar disorder, unspecified: Secondary | ICD-10-CM | POA: Insufficient documentation

## 2012-05-04 DIAGNOSIS — R1011 Right upper quadrant pain: Secondary | ICD-10-CM | POA: Insufficient documentation

## 2012-05-04 DIAGNOSIS — R112 Nausea with vomiting, unspecified: Secondary | ICD-10-CM

## 2012-05-04 DIAGNOSIS — R1013 Epigastric pain: Secondary | ICD-10-CM | POA: Insufficient documentation

## 2012-05-04 DIAGNOSIS — F17200 Nicotine dependence, unspecified, uncomplicated: Secondary | ICD-10-CM | POA: Insufficient documentation

## 2012-05-04 LAB — HEPATIC FUNCTION PANEL
ALT: 26 U/L (ref 0–53)
AST: 18 U/L (ref 0–37)
Albumin: 3.8 g/dL (ref 3.5–5.2)
Alkaline Phosphatase: 135 U/L — ABNORMAL HIGH (ref 39–117)
Bilirubin, Direct: <0.1 mg/dL (ref 0.0–0.3)
Total Bilirubin: 0.1 mg/dL — ABNORMAL LOW (ref 0.3–1.2)
Total Protein: 6.8 g/dL (ref 6.0–8.3)

## 2012-05-04 LAB — VALPROIC ACID LEVEL: Valproic Acid Lvl: <10 ug/mL — ABNORMAL LOW (ref 50.0–100.0)

## 2012-05-04 LAB — CBC WITH DIFFERENTIAL/PLATELET
Basophils Absolute: 0 10*3/uL (ref 0.0–0.1)
Basophils Relative: 0 % (ref 0–1)
Eosinophils Absolute: 0.1 10*3/uL (ref 0.0–0.7)
Eosinophils Relative: 1 % (ref 0–5)
HCT: 39 % (ref 39.0–52.0)
Hemoglobin: 13.6 g/dL (ref 13.0–17.0)
Lymphocytes Relative: 23 % (ref 12–46)
Lymphs Abs: 2.4 10*3/uL (ref 0.7–4.0)
MCH: 28.7 pg (ref 26.0–34.0)
MCHC: 34.9 g/dL (ref 30.0–36.0)
MCV: 82.3 fL (ref 78.0–100.0)
Monocytes Absolute: 0.5 10*3/uL (ref 0.1–1.0)
Monocytes Relative: 5 % (ref 3–12)
Neutro Abs: 7.2 10*3/uL (ref 1.7–7.7)
Neutrophils Relative %: 71 % (ref 43–77)
Platelets: 254 10*3/uL (ref 150–400)
RBC: 4.74 MIL/uL (ref 4.22–5.81)
RDW: 13.3 % (ref 11.5–15.5)
WBC: 10.2 10*3/uL (ref 4.0–10.5)

## 2012-05-04 LAB — COMPREHENSIVE METABOLIC PANEL WITH GFR
ALT: 29 U/L (ref 0–53)
AST: 19 U/L (ref 0–37)
Albumin: 4.4 g/dL (ref 3.5–5.2)
Alkaline Phosphatase: 147 U/L — ABNORMAL HIGH (ref 39–117)
BUN: 15 mg/dL (ref 6–23)
CO2: 25 meq/L (ref 19–32)
Calcium: 9.9 mg/dL (ref 8.4–10.5)
Chloride: 102 meq/L (ref 96–112)
Creatinine, Ser: 0.9 mg/dL (ref 0.50–1.35)
GFR calc Af Amer: >90 mL/min
GFR calc non Af Amer: >90 mL/min
Glucose, Bld: 93 mg/dL (ref 70–99)
Potassium: 3.9 meq/L (ref 3.5–5.1)
Sodium: 138 meq/L (ref 135–145)
Total Bilirubin: 0.2 mg/dL — ABNORMAL LOW (ref 0.3–1.2)
Total Protein: 7.5 g/dL (ref 6.0–8.3)

## 2012-05-04 LAB — LIPASE, BLOOD: Lipase: 25 U/L (ref 11–59)

## 2012-05-04 MED ORDER — FAMOTIDINE 20 MG PO TABS: 20.0000 mg | ORAL_TABLET | Freq: Two times a day (BID) | ORAL | Status: DC

## 2012-05-04 MED ORDER — SODIUM CHLORIDE 0.9 % IV BOLUS (SEPSIS)
1000.0000 mL | Freq: Once | INTRAVENOUS | Status: AC
  Administered 2012-05-04: 1000 mL via INTRAVENOUS

## 2012-05-04 MED ORDER — GI COCKTAIL ~~LOC~~
30.0000 mL | Freq: Once | ORAL | Status: AC
  Administered 2012-05-04: 30 mL via ORAL
  Filled 2012-05-04: qty 30

## 2012-05-04 MED ORDER — SODIUM CHLORIDE 0.9 % IV SOLN
Freq: Once | INTRAVENOUS | Status: AC
  Administered 2012-05-04: 18:00:00 via INTRAVENOUS

## 2012-05-04 MED ORDER — ONDANSETRON 8 MG PO TBDP: 8.0000 mg | ORAL_TABLET | Freq: Three times a day (TID) | ORAL | Status: DC | PRN

## 2012-05-04 MED ORDER — ONDANSETRON HCL 4 MG/2ML IJ SOLN
4.0000 mg | Freq: Once | INTRAMUSCULAR | Status: AC
  Administered 2012-05-04: 4 mg via INTRAVENOUS
  Filled 2012-05-04: qty 2

## 2012-05-04 NOTE — ED Notes (Signed)
Pt states he has not been taking his medications for his bipolar but he is not suicidal. Pt stays at shelter.

## 2012-05-04 NOTE — ED Notes (Signed)
Pt reports 2 day hx of nausea and vomiting, no diarrhea, with diffuse abdominal pain. Pt reports dizziness. Unsure of fevers, reports chills.

## 2012-05-04 NOTE — ED Provider Notes (Addendum)
History     CSN: [id number]  Arrival date & time 05/04/12  1349   First MD Initiated Contact with Patient 05/04/12 1539      Chief Complaint  Patient presents with  . Nausea  . Emesis  . Abdominal Pain  . Dizziness  . Headache    (Consider location/radiation/quality/duration/timing/severity/associated sxs/prior treatment) HPI Comments: Pt comes in with cc of abd pain and nausea, vomiting. Pt has no medical hx, and he is not taking his psych meds. He has no suicidal or homicidal ideations. Pt states that for the past 24 hours he has been having some epigastric pain, diffuse pain and associated n/v. Pt has had about 20 episodes of emesis, bilious, and last one being prior to arrival. No diarrhea. No abd surgeries.  Patient is a 21 y.o. male presenting with vomiting, abdominal pain, and headaches. The history is provided by the patient.  Emesis  Associated symptoms include abdominal pain. Pertinent negatives include no arthralgias, no chills, no cough, no diarrhea and no fever.  Abdominal Pain The primary symptoms of the illness include abdominal pain, nausea and vomiting. The primary symptoms of the illness do not include fever, shortness of breath, diarrhea or dysuria.  Symptoms associated with the illness do not include chills or constipation.  Headache  Associated symptoms include nausea and vomiting. Pertinent negatives include no fever and no shortness of breath.    Past Medical History  Diagnosis Date  . Bipolar 1 disorder   . Asthma     History reviewed. No pertinent past surgical history.  History reviewed. No pertinent family history.  History  Substance Use Topics  . Smoking status: Current Some Day Smoker  . Smokeless tobacco: Not on file  . Alcohol Use: No      Review of Systems  Constitutional: Negative for fever, chills, activity change and appetite change.  HENT: Negative for neck pain.   Eyes: Negative for visual disturbance.  Respiratory:  Negative for cough, chest tightness and shortness of breath.   Cardiovascular: Negative for chest pain.  Gastrointestinal: Positive for nausea, vomiting and abdominal pain. Negative for diarrhea, constipation and abdominal distention.  Genitourinary: Negative for dysuria, enuresis and difficulty urinating.  Musculoskeletal: Negative for arthralgias.  Neurological: Positive for dizziness. Negative for light-headedness.  Psychiatric/Behavioral: Negative for confusion.    Allergies  Geodon and Haldol  Home Medications   Current Outpatient Rx  Name Route Sig Dispense Refill  . ALBUTEROL SULFATE HFA 108 (90 BASE) MCG/ACT IN AERS Inhalation Inhale 2 puffs into the lungs every 6 (six) hours as needed. For wheezing    . BUPROPION HCL ER (XL) 150 MG PO TB24 Oral Take 150 mg by mouth every morning.    Marland Kitchen DIVALPROEX SODIUM ER 500 MG PO TB24 Oral Take 1,000 mg by mouth 2 (two) times daily.     . IBUPROFEN 800 MG PO TABS Oral Take 800 mg by mouth 3 (three) times daily as needed. For pain.    Marland Kitchen LEVOTHYROXINE SODIUM 50 MCG PO TABS Oral Take 50 mcg by mouth daily.    Marland Kitchen LORATADINE 10 MG PO TABS Oral Take 10 mg by mouth daily as needed. For allergies.    Marland Kitchen MINOCYCLINE HCL 100 MG PO CAPS Oral Take 100 mg by mouth daily. Takes cont.    . ADULT MULTIVITAMIN W/MINERALS CH Oral Take 1 tablet by mouth daily.    Marland Kitchen RANITIDINE HCL 150 MG PO TABS Oral Take 150 mg by mouth daily.    Marland Kitchen RISPERIDONE  MICROSPHERES 37.5 MG IM SUSR Intramuscular Inject 37.5 mg into the muscle every 14 (fourteen) days. Pt get this injection at [facility]. Due on 01/13/12    . ZOLPIDEM TARTRATE 10 MG PO TABS Oral Take 10 mg by mouth at bedtime as needed. For insomnia.      BP 111/56  Pulse 81  Temp 98 F (36.7 C) (Oral)  Resp 16  SpO2 99%  Physical Exam  Nursing note and vitals reviewed. Constitutional: He is oriented to person, place, and time. He appears well-developed.  HENT:  Head: Normocephalic and atraumatic.  Eyes:  Conjunctivae normal and EOM are normal. Pupils are equal, round, and reactive to light.  Neck: Normal range of motion. Neck supple.  Cardiovascular: Normal rate and regular rhythm.   Pulmonary/Chest: Effort normal and breath sounds normal.  Abdominal: Soft. Bowel sounds are normal. He exhibits no distension. There is tenderness. There is no rebound and no guarding.       Epigastric and ruq tenderness, neg murhpy's  Neurological: He is alert and oriented to person, place, and time.  Skin: Skin is warm.    ED Course  Procedures (including critical care time)  Labs Reviewed  COMPREHENSIVE METABOLIC PANEL - Abnormal; Notable for the following:    Alkaline Phosphatase 147 (*)     Total Bilirubin 0.2 (*)     All other components within normal limits  HEPATIC FUNCTION PANEL - Abnormal; Notable for the following:    Alkaline Phosphatase 135 (*)     Total Bilirubin 0.1 (*)     All other components within normal limits  VALPROIC ACID LEVEL - Abnormal; Notable for the following:    Valproic Acid Lvl <10.0 (*)     All other components within normal limits  CBC WITH DIFFERENTIAL  LIPASE, BLOOD   No results found.   No diagnosis found.    MDM  DDx includes: Pancreatitis Hepatobiliary pathology including cholecystitis Gastritis/PUD SBO ACS syndrome Aortic Dissection Gastroenteritis  Pt comes in w/ cc of emesis and abd pain. Will get basic labs.  He had RUQ pain - will get [place] for gall stones. No suspicion for cholecystitis. Pt has no renal complains, no concerns for renal stones.  NO ACTIVE PSYCHIATRIC COMPLAINS.   Non specific lab and imaging finding - which were communicated to the patient and GI f/u provided.         Derwood Kaplan, MD 05/04/12 [id number]  Derwood Kaplan, MD 05/04/12 5409

## 2012-05-04 NOTE — ED Notes (Signed)
Pt reports he has no money to pay for his prescriptions. Case Management has been paged.

## 2012-05-05 ENCOUNTER — Encounter (HOSPITAL_COMMUNITY): Payer: Self-pay

## 2012-05-05 ENCOUNTER — Emergency Department (HOSPITAL_COMMUNITY)
Admission: EM | Admit: 2012-05-05 | Discharge: 2012-05-06 | Disposition: A | Discharge status: Home / Self Care | Payer: Medicaid Other | Attending: Emergency Medicine | Admitting: Emergency Medicine

## 2012-05-05 ENCOUNTER — Emergency Department (HOSPITAL_COMMUNITY): Payer: Medicaid Other

## 2012-05-05 DIAGNOSIS — F17200 Nicotine dependence, unspecified, uncomplicated: Secondary | ICD-10-CM | POA: Insufficient documentation

## 2012-05-05 DIAGNOSIS — J45909 Unspecified asthma, uncomplicated: Secondary | ICD-10-CM | POA: Insufficient documentation

## 2012-05-05 DIAGNOSIS — E039 Hypothyroidism, unspecified: Secondary | ICD-10-CM | POA: Insufficient documentation

## 2012-05-05 DIAGNOSIS — J40 Bronchitis, not specified as acute or chronic: Secondary | ICD-10-CM | POA: Insufficient documentation

## 2012-05-05 DIAGNOSIS — F319 Bipolar disorder, unspecified: Secondary | ICD-10-CM | POA: Insufficient documentation

## 2012-05-05 DIAGNOSIS — R112 Nausea with vomiting, unspecified: Secondary | ICD-10-CM

## 2012-05-05 LAB — URINALYSIS, ROUTINE W REFLEX MICROSCOPIC
Bilirubin Urine: NEGATIVE
Glucose, UA: NEGATIVE mg/dL
Hgb urine dipstick: NEGATIVE
Ketones, ur: NEGATIVE mg/dL
Leukocytes, UA: NEGATIVE
Nitrite: NEGATIVE
Protein, ur: NEGATIVE mg/dL
Specific Gravity, Urine: 1.028 (ref 1.005–1.030)
Urobilinogen, UA: 0.2 mg/dL (ref 0.0–1.0)
pH: 6 (ref 5.0–8.0)

## 2012-05-05 MED ORDER — GI COCKTAIL ~~LOC~~
30.0000 mL | Freq: Once | ORAL | Status: AC
  Administered 2012-05-05: 30 mL via ORAL
  Filled 2012-05-05: qty 30

## 2012-05-05 MED ORDER — SODIUM CHLORIDE 0.9 % IV BOLUS (SEPSIS)
1000.0000 mL | Freq: Once | INTRAVENOUS | Status: AC
  Administered 2012-05-05: 1000 mL via INTRAVENOUS

## 2012-05-05 MED ORDER — ALBUTEROL SULFATE (5 MG/ML) 0.5% IN NEBU
5.0000 mg | INHALATION_SOLUTION | Freq: Once | RESPIRATORY_TRACT | Status: AC
  Administered 2012-05-05: 5 mg via RESPIRATORY_TRACT
  Filled 2012-05-05: qty 1

## 2012-05-05 MED ORDER — ALBUTEROL SULFATE HFA 108 (90 BASE) MCG/ACT IN AERS
2.0000 | INHALATION_SPRAY | Freq: Four times a day (QID) | RESPIRATORY_TRACT | Status: DC
  Administered 2012-05-06: 2 via RESPIRATORY_TRACT
  Filled 2012-05-05: qty 6.7

## 2012-05-05 MED ORDER — PREDNISONE 10 MG PO TABS: 10.0000 mg | ORAL_TABLET | Freq: Every day | ORAL | Status: DC

## 2012-05-05 MED ORDER — ONDANSETRON HCL 4 MG/2ML IJ SOLN
4.0000 mg | Freq: Once | INTRAMUSCULAR | Status: AC
  Administered 2012-05-05: 4 mg via INTRAVENOUS
  Filled 2012-05-05: qty 2

## 2012-05-05 MED ORDER — FAMOTIDINE IN NACL 20-0.9 MG/50ML-% IV SOLN
20.0000 mg | Freq: Once | INTRAVENOUS | Status: AC
  Administered 2012-05-05: 20 mg via INTRAVENOUS
  Filled 2012-05-05: qty 50

## 2012-05-05 MED ORDER — IPRATROPIUM BROMIDE 0.02 % IN SOLN
0.5000 mg | Freq: Once | RESPIRATORY_TRACT | Status: AC
  Administered 2012-05-05: 0.5 mg via RESPIRATORY_TRACT
  Filled 2012-05-05: qty 2.5

## 2012-05-05 NOTE — ED Notes (Signed)
[id number]<AV> Expected date:<BR> Expected time:<BR> Means of arrival:<BR> Comments:<BR> Medic 100, 21 M, Vomitting

## 2012-05-05 NOTE — ED Provider Notes (Signed)
History     CSN: [id number]  Arrival date & time 05/05/12  2010   First MD Initiated Contact with Patient 05/05/12 2059      Chief Complaint  Patient presents with  . Nausea  . Emesis  . Diarrhea    (Consider location/radiation/quality/duration/timing/severity/associated sxs/prior treatment) HPI History provided by pt and prior chart.  Pt presents w/ N/V/D and generalized weakness x 2 days.  Has abd pain only when he is vomiting.  Associated w/ tactile fever and increased urinary frequency.   Denies hematemesis/hematochezia/melena.   Past abd surgeries include appendectomy.  Also c/o cough productive of white sputum since last night, as well as facial and bilateral ear pressure.  No known sick contacts, but pt lives at [facility]  Per prior chart, pt seen for same in ED yesterday, though he complained of abd pain at that time.  Labs unremarkable.  [place] abd significant for hepatic steatosis.  Pt felt better when he left ED but symptoms worsened again this afternoon.   Past Medical History  Diagnosis Date  . Bipolar 1 disorder   . Asthma   . Hypothyroid     History reviewed. No pertinent past surgical history.  History reviewed. No pertinent family history.  History  Substance Use Topics  . Smoking status: Current Some Day Smoker  . Smokeless tobacco: Not on file  . Alcohol Use: No      Review of Systems  All other systems reviewed and are negative.    Allergies  Geodon and Haldol  Home Medications   Current Outpatient Rx  Name Route Sig Dispense Refill  . ALBUTEROL SULFATE HFA 108 (90 BASE) MCG/ACT IN AERS Inhalation Inhale 2 puffs into the lungs every 6 (six) hours as needed. For wheezing    . BUPROPION HCL ER (XL) 150 MG PO TB24 Oral Take 150 mg by mouth every morning.    Marland Kitchen DIVALPROEX SODIUM ER 500 MG PO TB24 Oral Take 1,000 mg by mouth 2 (two) times daily.     Marland Kitchen FAMOTIDINE 20 MG PO TABS Oral Take 20 mg by mouth 2 (two) times daily.    . IBUPROFEN 800 MG PO  TABS Oral Take 800 mg by mouth 3 (three) times daily as needed. For pain.    Marland Kitchen LEVOTHYROXINE SODIUM 50 MCG PO TABS Oral Take 50 mcg by mouth daily.    Marland Kitchen LORATADINE 10 MG PO TABS Oral Take 10 mg by mouth daily as needed. For allergies.    Marland Kitchen MINOCYCLINE HCL 100 MG PO CAPS Oral Take 100 mg by mouth daily. Takes cont.    . ADULT MULTIVITAMIN W/MINERALS CH Oral Take 1 tablet by mouth daily.    Marland Kitchen ONDANSETRON 8 MG PO TBDP Oral Take 8 mg by mouth every 8 (eight) hours as needed.    Marland Kitchen RANITIDINE HCL 150 MG PO TABS Oral Take 150 mg by mouth daily.    Marland Kitchen RISPERIDONE MICROSPHERES 37.5 MG IM SUSR Intramuscular Inject 37.5 mg into the muscle every 14 (fourteen) days. Pt get this injection at [facility] Due on 01/13/12    . ZOLPIDEM TARTRATE 10 MG PO TABS Oral Take 10 mg by mouth at bedtime as needed. For insomnia.      BP 138/72  Pulse 104  Temp 98.4 F (36.9 C) (Oral)  Resp 20  SpO2 99%  Physical Exam  Nursing note and vitals reviewed. Constitutional: He is oriented to person, place, and time. He appears well-developed and well-nourished. No distress.  HENT:  Head: Normocephalic and atraumatic.  Mouth/Throat: Oropharynx is clear and moist.       Moist mucous membranes.  Maxillary sinus ttp.  Nasal congestion.  Eyes:       Normal appearance  Neck: Normal range of motion.  Cardiovascular: Normal rate and regular rhythm.   Pulmonary/Chest: Effort normal and breath sounds normal. No respiratory distress.       coughing  Abdominal: Soft. Bowel sounds are normal. He exhibits no distension and no mass. There is no tenderness. There is no rebound and no guarding.       Epigastric and RUQ ttp  Genitourinary:       No CVA tenderness  Musculoskeletal: Normal range of motion.  Neurological: He is alert and oriented to person, place, and time.  Skin: Skin is warm and dry. No rash noted.  Psychiatric: He has a normal mood and affect. His behavior is normal.    ED Course  Procedures (including  critical care time)  Labs Reviewed - No data to display US Abdomen Complete  05/04/2012  *RADIOLOGY REPORT*  Clinical Data:  2-day history of diffuse abdominal pain and nausea/vomiting.  COMPLETE ABDOMINAL ULTRASOUND  Comparison:  None.  Findings:  Gallbladder:  No shadowing gallstones or echogenic sludge.  No gallbladder wall thickening or pericholecystic fluid.  Negative sonographic Murphy's sign according to the ultrasound technologist.  Common bile duct:  Normal in caliber with maximum diameter approximating 4 mm.  Liver:  Diffusely increased and coarsened echotexture without focal parenchymal abnormality.  Patent portal vein with hepatopetal flow.  IVC:  Patent.  Pancreas:  Although the pancreas is difficult to visualize in its entirety, no focal pancreatic abnormality is identified.  Fluid filled structure identified adjacent to the pancreatic head.  Spleen:  Normal size and echotexture without focal parenchymal abnormality.  Right Kidney:  No hydronephrosis.  Well-preserved cortex.  No shadowing calculi.  Normal size and parenchymal echotexture without focal abnormalities.  Approximately 10.7 cm in length.  Left Kidney:  No hydronephrosis.  Well-preserved cortex.  No shadowing calculi.  Normal size and parenchymal echotexture without focal abnormalities.  Approximately 11.4 cm in length.  Abdominal aorta:  Normal in caliber throughout its visualized course in the abdomen without significant atherosclerosis.  IMPRESSION:  1.  Diffuse hepatic steatosis and/or hepatocellular disease without focal hepatic parenchymal abnormality. 2.  Fluid filled structure adjacent to the pancreatic head, likely the duodenum or possibly a duodenal diverticulum.  A cyst arising from the pancreatic head is felt less likely. 3.  Otherwise normal examination.   Original Report Authenticated By: Arnell Sieving, M.D.      1. Nausea and vomiting   2. Bronchitis       MDM  21yo M presents for the second day in a row  with N/V/D and weakness.  Had unremarkable labs and [place] abd yesterday.  Has prescriptions for zofran and pepcid which have given him some relief at home.  Also c/o cough and sinus pressure.  On exam, afebrile, mildly tachycardic, coughing, sinus ttp, mild epigastric and RUQ ttp.  Suspect viral URI and gastroenteritis.  CXR pending to r/o pneumonia and U/A to r/o UTI (not obtained yesterday).  IV fluids, pepcid and zofran as well as neb treatment ordered.  Will reassess shortly.    Pt reports that his abdominal pain and nausea are improved.  RT in room now to start breathing treatment.  U/A still pending.  10:25 PM   Pt reports that coughing, nausea and pain  have improved.  On repeat exam, VS have normalized, still coughing, abdomen non-tender.  D/c'd home w/ albuterol inhaler and prednisone.  Return precautions discussed. 11:48 PM        Otilio Miu, [place] 05/06/12 [phone]

## 2012-05-05 NOTE — ED Notes (Signed)
Per EMS, pt is from shelter.  Pt was seen yesterday at [facility] for n/v/d.  Was given meds.  Pt states he is not keeping meds down.  Vomiting every 30-40 min with diarrhea.  No vomiting in route.  Pt states vomit is black.  Vitals:  144/80, hr 100, resp 16, ox 99%.  Feels weak and fatigued.  Pt states he has been out of synthroid and depakote for week.  Hx, asthma, thyroid, bipolar

## 2012-05-05 NOTE — ED Notes (Signed)
Pt. given urinal. Advised need sample.  

## 2012-05-07 NOTE — ED Provider Notes (Signed)
Medical screening examination/treatment/procedure(s) were performed by non-physician practitioner and as supervising physician I was immediately available for consultation/collaboration.  Toy Baker, MD 05/07/12 [phone]

## 2012-05-14 ENCOUNTER — Emergency Department (HOSPITAL_COMMUNITY): Payer: Medicaid Other

## 2012-05-14 ENCOUNTER — Encounter (HOSPITAL_COMMUNITY): Payer: Self-pay

## 2012-05-14 ENCOUNTER — Emergency Department (HOSPITAL_COMMUNITY)
Admission: EM | Admit: 2012-05-14 | Discharge: 2012-05-14 | Disposition: A | Discharge status: Home / Self Care | Payer: Medicaid Other | Attending: Emergency Medicine | Admitting: Emergency Medicine

## 2012-05-14 DIAGNOSIS — L0291 Cutaneous abscess, unspecified: Secondary | ICD-10-CM

## 2012-05-14 DIAGNOSIS — R45851 Suicidal ideations: Secondary | ICD-10-CM

## 2012-05-14 DIAGNOSIS — R222 Localized swelling, mass and lump, trunk: Secondary | ICD-10-CM | POA: Insufficient documentation

## 2012-05-14 HISTORY — DX: Suicidal ideations: R45.851

## 2012-05-14 HISTORY — DX: Poisoning by unspecified drugs, medicaments and biological substances, accidental (unintentional), initial encounter: T50.901A

## 2012-05-14 LAB — RAPID URINE DRUG SCREEN, HOSP PERFORMED
Amphetamines: NOT DETECTED
Barbiturates: POSITIVE — AB
Benzodiazepines: NOT DETECTED
Cocaine: NOT DETECTED
Opiates: NOT DETECTED
Tetrahydrocannabinol: NOT DETECTED

## 2012-05-14 LAB — URINALYSIS, ROUTINE W REFLEX MICROSCOPIC
Bilirubin Urine: NEGATIVE
Glucose, UA: NEGATIVE mg/dL
Hgb urine dipstick: NEGATIVE
Ketones, ur: NEGATIVE mg/dL
Leukocytes, UA: NEGATIVE
Nitrite: NEGATIVE
Protein, ur: NEGATIVE mg/dL
Specific Gravity, Urine: 1.025 (ref 1.005–1.030)
Urobilinogen, UA: 0.2 mg/dL (ref 0.0–1.0)
pH: 6.5 (ref 5.0–8.0)

## 2012-05-14 LAB — CBC
HCT: 40.1 % (ref 39.0–52.0)
Hemoglobin: 13.5 g/dL (ref 13.0–17.0)
MCH: 27.6 pg (ref 26.0–34.0)
MCHC: 33.7 g/dL (ref 30.0–36.0)
MCV: 81.8 fL (ref 78.0–100.0)
Platelets: 292 10*3/uL (ref 150–400)
RBC: 4.9 MIL/uL (ref 4.22–5.81)
RDW: 13.3 % (ref 11.5–15.5)
WBC: 9.5 10*3/uL (ref 4.0–10.5)

## 2012-05-14 LAB — COMPREHENSIVE METABOLIC PANEL
ALT: 24 U/L (ref 0–53)
AST: 18 U/L (ref 0–37)
Albumin: 3.8 g/dL (ref 3.5–5.2)
Alkaline Phosphatase: 136 U/L — ABNORMAL HIGH (ref 39–117)
BUN: 15 mg/dL (ref 6–23)
CO2: 25 mEq/L (ref 19–32)
Calcium: 9.6 mg/dL (ref 8.4–10.5)
Chloride: 101 mEq/L (ref 96–112)
Creatinine, Ser: 0.65 mg/dL (ref 0.50–1.35)
GFR calc Af Amer: >90 mL/min (ref >90)
GFR calc non Af Amer: >90 mL/min (ref >90)
Glucose, Bld: 125 mg/dL — ABNORMAL HIGH (ref 70–99)
Potassium: 3.9 mEq/L (ref 3.5–5.1)
Sodium: 138 mEq/L (ref 135–145)
Total Bilirubin: 0.2 mg/dL — ABNORMAL LOW (ref 0.3–1.2)
Total Protein: 7 g/dL (ref 6.0–8.3)

## 2012-05-14 NOTE — ED Notes (Addendum)
PT IVC, present with the police, pt went to [facility] and refer here to be medically clear. Pt is SI, with plan to overdose. Pt attempted to overdose in past as well. Pt alert, oriented. No HI. Pt also sts he has a lump on his armpit that he just noticed today.

## 2012-05-14 NOTE — ED Notes (Signed)
All result been back, result faxed to [facility]

## 2012-06-19 NOTE — ED Provider Notes (Signed)
History     CSN: [id number]  Arrival date & time 05/14/12  1429   None     Chief Complaint  Patient presents with  . Medical Clearance  . Suicidal    (Consider location/radiation/quality/duration/timing/severity/associated sxs/prior treatment) HPI Comments: [name] 21 y.o. male   The chief complaint is: Patient presents with:   Medical Clearance   Suicidal    Patient presented to [facility] today for Suicidal ideation.  Here for medical clearance.  Hx of suicide attempt by overdose.  Patient states he has had increased SI, feeling extremely depressed, but no clear cut plan. Denies recent drug or etoh abuse.  Denies HI/AVH.   Patient states that he alos has a painful lump in his left axilla.  Red,nodular. Without discharge.  TTP.  Denies fevers, chills, myalgias, arthralgias. Denies DOE, SOB, chest tightness or pressure, radiation to left arm, jaw or back, or diaphoresis. Denies dysuria, flank pain, suprapubic pain, frequency, urgency, or hematuria. Denies headaches, light headedness, weakness, visual disturbances. Denies abdominal pain, nausea, vomiting, diarrhea or constipation.      The history is provided by the patient. No language interpreter was used.    Past Medical History  Diagnosis Date  . Bipolar 1 disorder   . Asthma   . Hypothyroid   . Suicidal thoughts   . Overdose     Past Surgical History  Procedure Date  . Appendectomy     No family history on file.  History  Substance Use Topics  . Smoking status: Current Some Day Smoker  . Smokeless tobacco: Not on file  . Alcohol Use: No      Review of Systems  Constitutional: Negative for fever and chills.  Respiratory: Negative for cough and shortness of breath.   Cardiovascular: Negative for chest pain and palpitations.  Gastrointestinal: Negative for vomiting, abdominal pain, diarrhea and constipation.  Genitourinary: Negative for dysuria, urgency and frequency.  Musculoskeletal:  Negative for myalgias and arthralgias.  Skin: Negative for rash.  Neurological: Negative for headaches.  Psychiatric/Behavioral: Positive for suicidal ideas, disturbed wake/sleep cycle and dysphoric mood. Negative for hallucinations and self-injury.    Allergies  Geodon and Haldol  Home Medications   Current Outpatient Rx  Name Route Sig Dispense Refill  . ALBUTEROL SULFATE HFA 108 (90 BASE) MCG/ACT IN AERS Inhalation Inhale 2 puffs into the lungs every 6 (six) hours as needed. For wheezing    . BUPROPION HCL ER (XL) 150 MG PO TB24 Oral Take 150 mg by mouth every morning.    Marland Kitchen DIVALPROEX SODIUM ER 500 MG PO TB24 Oral Take 1,000 mg by mouth 2 (two) times daily.     Marland Kitchen FAMOTIDINE 20 MG PO TABS Oral Take 20 mg by mouth 2 (two) times daily.    . IBUPROFEN 800 MG PO TABS Oral Take 800 mg by mouth 3 (three) times daily as needed. For pain.    Marland Kitchen LEVOTHYROXINE SODIUM 50 MCG PO TABS Oral Take 50 mcg by mouth daily.    Marland Kitchen LORATADINE 10 MG PO TABS Oral Take 10 mg by mouth daily as needed. For allergies.    Marland Kitchen MINOCYCLINE HCL 100 MG PO CAPS Oral Take 100 mg by mouth daily. Takes cont.    . ADULT MULTIVITAMIN W/MINERALS CH Oral Take 1 tablet by mouth daily.    Marland Kitchen ONDANSETRON 8 MG PO TBDP Oral Take 8 mg by mouth every 8 (eight) hours as needed.    Marland Kitchen PREDNISONE 10 MG PO TABS Oral Take 1  tablet (10 mg total) by mouth daily. 15 tablet 0  . RANITIDINE HCL 150 MG PO TABS Oral Take 150 mg by mouth daily.    Marland Kitchen RISPERIDONE MICROSPHERES 37.5 MG IM SUSR Intramuscular Inject 37.5 mg into the muscle every 14 (fourteen) days. Pt get this injection at [facility] Due on 01/13/12    . ZOLPIDEM TARTRATE 10 MG PO TABS Oral Take 10 mg by mouth at bedtime as needed. For insomnia.      BP 115/61  Pulse 99  Temp 98 F (36.7 C) (Oral)  Resp 16  SpO2 99%  Physical Exam  Nursing note and vitals reviewed. Constitutional: He appears well-developed and well-nourished. No distress.       Pleasant, obese male in NAD.   HENT:  Head: Normocephalic and atraumatic.  Eyes: Conjunctivae normal are normal. No scleral icterus.  Neck: Normal range of motion. Neck supple.  Cardiovascular: Normal rate, regular rhythm and normal heart sounds.   Pulmonary/Chest: Effort normal and breath sounds normal. No respiratory distress.  Abdominal: Soft. There is no tenderness.  Musculoskeletal: He exhibits no edema.       Arms: Neurological: He is alert.  Skin: Skin is warm and dry. He is not diaphoretic.  Psychiatric: His behavior is normal.    ED Course  Procedures (including critical care time) INCISION AND DRAINAGE Performed by: Arthor Captain Consent: Verbal consent obtained. Risks and benefits: risks, benefits and alternatives were discussed Type: abscess  Body area: left axilla  Anesthesia: local infiltration  Local anesthetic: lidocaine 2% with epinephrine  Anesthetic total: 2 ml  Complexity: complex Blunt dissection to break up loculations  Drainage: purulent  Drainage amount: moderated  Packing material: 1/4 in iodoform gauze  Patient tolerance: Patient tolerated the procedure well with no immediate complications.    Labs Reviewed  COMPREHENSIVE METABOLIC PANEL - Abnormal; Notable for the following:    Glucose, Bld 125 (*)     Alkaline Phosphatase 136 (*)     Total Bilirubin 0.2 (*)     All other components within normal limits  URINALYSIS, ROUTINE W REFLEX MICROSCOPIC - Abnormal; Notable for the following:    APPearance CLOUDY (*)     All other components within normal limits  URINE RAPID DRUG SCREEN (HOSP PERFORMED) - Abnormal; Notable for the following:    Barbiturates POSITIVE (*)     All other components within normal limits  CBC  LAB REPORT - SCANNED   No results found.   1. Suicidal ideation   2. Abscess       MDM  Patient labs back. He is medically clear for admission to [facility]. Patient may return for wound recheck in 2 days.         Arthor Captain,  PA-C 06/19/12 1956

## 2012-06-29 NOTE — ED Provider Notes (Signed)
Medical screening examination/treatment/procedure(s) were performed by non-physician practitioner and as supervising physician I was immediately available for consultation/collaboration.  Lillyrose Reitan T Lakresha Stifter, MD 06/29/12 0758 

## 2012-07-09 ENCOUNTER — Encounter (HOSPITAL_COMMUNITY): Payer: Self-pay | Admitting: Emergency Medicine

## 2012-07-09 ENCOUNTER — Emergency Department (HOSPITAL_COMMUNITY)
Admission: EM | Admit: 2012-07-09 | Discharge: 2012-07-09 | Disposition: A | Discharge status: Home / Self Care | Payer: Medicaid Other | Attending: Emergency Medicine | Admitting: Emergency Medicine

## 2012-07-09 DIAGNOSIS — F17200 Nicotine dependence, unspecified, uncomplicated: Secondary | ICD-10-CM | POA: Insufficient documentation

## 2012-07-09 DIAGNOSIS — Z79899 Other long term (current) drug therapy: Secondary | ICD-10-CM | POA: Insufficient documentation

## 2012-07-09 DIAGNOSIS — E039 Hypothyroidism, unspecified: Secondary | ICD-10-CM | POA: Insufficient documentation

## 2012-07-09 DIAGNOSIS — L739 Follicular disorder, unspecified: Secondary | ICD-10-CM

## 2012-07-09 DIAGNOSIS — J45909 Unspecified asthma, uncomplicated: Secondary | ICD-10-CM | POA: Insufficient documentation

## 2012-07-09 DIAGNOSIS — L738 Other specified follicular disorders: Secondary | ICD-10-CM | POA: Insufficient documentation

## 2012-07-09 DIAGNOSIS — F319 Bipolar disorder, unspecified: Secondary | ICD-10-CM | POA: Insufficient documentation

## 2012-07-09 MED ORDER — CLINDAMYCIN PHOSPHATE 1 % EX LOTN: TOPICAL_LOTION | Freq: Two times a day (BID) | CUTANEOUS | Status: DC

## 2012-07-09 NOTE — ED Notes (Signed)
Pt sts that he has had intermittent pain with urination for several days. Pt also sts he has a "lump" at the base of his penis shaft. Denies any discharge.

## 2012-07-09 NOTE — ED Provider Notes (Signed)
History     CSN: [id number]  Arrival date & time 07/09/12  7846   First MD Initiated Contact with Patient 07/09/12 1946      Chief Complaint  Patient presents with  . Testicle Pain    (Consider location/radiation/quality/duration/timing/severity/associated sxs/prior treatment) HPI Pt with 3 days of "bumps" to pubic area. No similar symptoms in the past. No fever, chills, penile or testicular pain. No penile d/c. No masses or trauma. Pt is not sexually active. Past Medical History  Diagnosis Date  . Bipolar 1 disorder   . Asthma   . Hypothyroid   . Suicidal thoughts   . Overdose     Past Surgical History  Procedure Date  . Appendectomy     No family history on file.  History  Substance Use Topics  . Smoking status: Current Some Day Smoker  . Smokeless tobacco: Not on file  . Alcohol Use: No      Review of Systems  Constitutional: Negative for fever and chills.  Genitourinary: Negative for discharge, penile swelling, scrotal swelling, penile pain and testicular pain.  Skin: Positive for rash. Negative for wound.    Allergies  Geodon and Haldol  Home Medications   Current Outpatient Rx  Name  Route  Sig  Dispense  Refill  . ALBUTEROL SULFATE HFA 108 (90 BASE) MCG/ACT IN AERS   Inhalation   Inhale 2 puffs into the lungs every 6 (six) hours as needed. For wheezing         . BUPROPION HCL ER (XL) 150 MG PO TB24   Oral   Take 150 mg by mouth every morning.         Marland Kitchen CLINDAMYCIN PHOSPHATE 1 % EX LOTN   Topical   Apply topically 2 (two) times daily.   60 mL   0   . DIVALPROEX SODIUM ER 500 MG PO TB24   Oral   Take 1,000 mg by mouth 2 (two) times daily.          Marland Kitchen FAMOTIDINE 20 MG PO TABS   Oral   Take 20 mg by mouth 2 (two) times daily.         . IBUPROFEN 800 MG PO TABS   Oral   Take 800 mg by mouth 3 (three) times daily as needed. For pain.         Marland Kitchen LEVOTHYROXINE SODIUM 50 MCG PO TABS   Oral   Take 50 mcg by mouth daily.           Marland Kitchen LORATADINE 10 MG PO TABS   Oral   Take 10 mg by mouth daily as needed. For allergies.         Marland Kitchen MINOCYCLINE HCL 100 MG PO CAPS   Oral   Take 100 mg by mouth daily. Takes cont.         . ADULT MULTIVITAMIN W/MINERALS CH   Oral   Take 1 tablet by mouth daily.         Marland Kitchen ONDANSETRON 8 MG PO TBDP   Oral   Take 8 mg by mouth every 8 (eight) hours as needed.         Marland Kitchen PREDNISONE 10 MG PO TABS   Oral   Take 1 tablet (10 mg total) by mouth daily.   15 tablet   0   . RANITIDINE HCL 150 MG PO TABS   Oral   Take 150 mg by mouth daily.         Marland Kitchen  RISPERIDONE MICROSPHERES 37.5 MG IM SUSR   Intramuscular   Inject 37.5 mg into the muscle every 14 (fourteen) days. Pt get this injection at [facility]. Due on 01/13/12         . ZOLPIDEM TARTRATE 10 MG PO TABS   Oral   Take 10 mg by mouth at bedtime as needed. For insomnia.           BP 145/90  Pulse 102  Temp 99 F (37.2 C)  Resp 20  SpO2 99%  Physical Exam  Nursing note and vitals reviewed. Constitutional: He is oriented to person, place, and time. He appears well-developed and well-nourished. No distress.  HENT:  Head: Normocephalic and atraumatic.  Mouth/Throat: Oropharynx is clear and moist.  Eyes: EOM are normal. Pupils are equal, round, and reactive to light.  Neck: Normal range of motion. Neck supple.  Cardiovascular: Normal rate.   Pulmonary/Chest: Effort normal.  Abdominal: Soft. Bowel sounds are normal. He exhibits no mass. There is no tenderness. There is no rebound and no guarding.  Genitourinary: Penis normal. No penile tenderness.       Normal testicular exam. No penile D/C. No inguinal hernia. Shaved pubic region with diffuse folliculitis esp at base of penis.   Musculoskeletal: Normal range of motion. He exhibits no edema and no tenderness.  Neurological: He is alert and oriented to person, place, and time.  Skin: Skin is warm and dry. No rash noted. No erythema.  Psychiatric: He has  a normal mood and affect. His behavior is normal.    ED Course  Procedures (including critical care time)  Labs Reviewed - No data to display No results found.   1. Perineal folliculitis       MDM          Loren Racer, MD 07/09/12 2021

## 2012-08-29 ENCOUNTER — Emergency Department (HOSPITAL_COMMUNITY): Payer: Medicaid Other

## 2012-08-29 ENCOUNTER — Emergency Department (HOSPITAL_COMMUNITY)
Admission: EM | Admit: 2012-08-29 | Discharge: 2012-08-29 | Disposition: A | Discharge status: Home / Self Care | Payer: Medicaid Other | Attending: Emergency Medicine | Admitting: Emergency Medicine

## 2012-08-29 ENCOUNTER — Encounter (HOSPITAL_COMMUNITY): Payer: Self-pay | Admitting: *Deleted

## 2012-08-29 DIAGNOSIS — F17200 Nicotine dependence, unspecified, uncomplicated: Secondary | ICD-10-CM | POA: Insufficient documentation

## 2012-08-29 DIAGNOSIS — Z79899 Other long term (current) drug therapy: Secondary | ICD-10-CM | POA: Insufficient documentation

## 2012-08-29 DIAGNOSIS — R05 Cough: Secondary | ICD-10-CM | POA: Insufficient documentation

## 2012-08-29 DIAGNOSIS — R509 Fever, unspecified: Secondary | ICD-10-CM | POA: Insufficient documentation

## 2012-08-29 DIAGNOSIS — Z862 Personal history of diseases of the blood and blood-forming organs and certain disorders involving the immune mechanism: Secondary | ICD-10-CM | POA: Insufficient documentation

## 2012-08-29 DIAGNOSIS — B349 Viral infection, unspecified: Secondary | ICD-10-CM

## 2012-08-29 DIAGNOSIS — R059 Cough, unspecified: Secondary | ICD-10-CM | POA: Insufficient documentation

## 2012-08-29 DIAGNOSIS — J45909 Unspecified asthma, uncomplicated: Secondary | ICD-10-CM | POA: Insufficient documentation

## 2012-08-29 DIAGNOSIS — R5381 Other malaise: Secondary | ICD-10-CM | POA: Insufficient documentation

## 2012-08-29 DIAGNOSIS — F319 Bipolar disorder, unspecified: Secondary | ICD-10-CM | POA: Insufficient documentation

## 2012-08-29 DIAGNOSIS — Z8639 Personal history of other endocrine, nutritional and metabolic disease: Secondary | ICD-10-CM | POA: Insufficient documentation

## 2012-08-29 DIAGNOSIS — B9789 Other viral agents as the cause of diseases classified elsewhere: Secondary | ICD-10-CM | POA: Insufficient documentation

## 2012-08-29 LAB — POCT I-STAT, CHEM 8
BUN: 6 mg/dL (ref 6–23)
Calcium, Ion: 1.1 mmol/L — ABNORMAL LOW (ref 1.12–1.23)
Chloride: 100 mEq/L (ref 96–112)
Creatinine, Ser: 0.8 mg/dL (ref 0.50–1.35)
Glucose, Bld: 91 mg/dL (ref 70–99)
HCT: 40 % (ref 39.0–52.0)
Hemoglobin: 13.6 g/dL (ref 13.0–17.0)
Potassium: 3.7 mEq/L (ref 3.5–5.1)
Sodium: 139 mEq/L (ref 135–145)
TCO2: 27 mmol/L (ref 0–100)

## 2012-08-29 MED ORDER — SODIUM CHLORIDE 0.9 % IV BOLUS (SEPSIS)
1000.0000 mL | Freq: Once | INTRAVENOUS | Status: AC
  Administered 2012-08-29: 1000 mL via INTRAVENOUS

## 2012-08-29 NOTE — ED Notes (Signed)
Dr. Jacubowitz at bedside 

## 2012-08-29 NOTE — ED Notes (Signed)
Pt states feels a little dizzy; pt denies nausea/vomiting/diarrhea. Pt states has not had flu shot. Pt states generally feeling not well and cough since Friday. Pt mentating appropriately.

## 2012-08-29 NOTE — ED Notes (Signed)
Pt ambulatory leaving ED by self. Pt given d.c teaching and follow up care instructions. Pt has no further questions upon d/c teaching. Pt does not appear to be in acute distress upon d/c.

## 2012-08-29 NOTE — ED Notes (Signed)
Pt reports productive cough since Friday, has green sputum production. Having bodyaches, weakness and chills/fevers and reports vomiting that was 3 days ago.

## 2012-08-29 NOTE — ED Provider Notes (Signed)
History     CSN: [id number]  Arrival date & time 08/29/12  1252   First MD Initiated Contact with Patient 08/29/12 1325      Chief Complaint  Patient presents with  . Cough  . Fever  . Emesis    (Consider location/radiation/quality/duration/timing/severity/associated sxs/prior treatment) HPI Complains of lightheadedness, cough, subjective fever chills onset 3 days ago patient reports he vomited 3 days ago none since. He treated himself with ibuprofen, without relief. No other associated symptoms. No other complaint Past Medical History  Diagnosis Date  . Bipolar 1 disorder   . Asthma   . Hypothyroid   . Suicidal thoughts   . Overdose     Past Surgical History  Procedure Date  . Appendectomy     History reviewed. No pertinent family history.  History  Substance Use Topics  . Smoking status: Current Some Day Smoker  . Smokeless tobacco: Not on file  . Alcohol Use: No      Review of Systems  Constitutional: Positive for fever and appetite change.       Appetite diminished, subjective fever  Respiratory: Positive for cough.   Neurological: Positive for weakness.       Generalized weakness    Allergies  Geodon and Haldol  Home Medications   Current Outpatient Rx  Name  Route  Sig  Dispense  Refill  . ALBUTEROL SULFATE HFA 108 (90 BASE) MCG/ACT IN AERS   Inhalation   Inhale 2 puffs into the lungs every 6 (six) hours as needed. For wheezing         . IBUPROFEN 800 MG PO TABS   Oral   Take 800 mg by mouth 3 (three) times daily as needed. For pain.         Marland Kitchen LORATADINE 10 MG PO TABS   Oral   Take 10 mg by mouth daily as needed. For allergies.         . ADULT MULTIVITAMIN W/MINERALS CH   Oral   Take 1 tablet by mouth daily.         Marland Kitchen RANITIDINE HCL 150 MG PO TABS   Oral   Take 150 mg by mouth daily as needed. For acid reflux           BP 131/91  Pulse 98  Temp 98.2 F (36.8 C) (Oral)  Resp 20  SpO2 99%  Physical Exam  Nursing  note and vitals reviewed. Constitutional: He appears well-developed and well-nourished.  HENT:  Head: Normocephalic and atraumatic.       Mucous membranes dry  Eyes: Conjunctivae normal are normal. Pupils are equal, round, and reactive to light.  Neck: Neck supple. No tracheal deviation present. No thyromegaly present.  Cardiovascular: Normal rate and regular rhythm.   No murmur heard. Pulmonary/Chest: Effort normal and breath sounds normal.  Abdominal: Soft. Bowel sounds are normal. He exhibits no distension. There is no tenderness.  Musculoskeletal: Normal range of motion. He exhibits no edema and no tenderness.  Neurological: He is alert. Coordination normal.  Skin: Skin is warm and dry. No rash noted.  Psychiatric: He has a normal mood and affect.    ED Course  Procedures (including critical care time)  Labs Reviewed - No data to display No results found. Results for orders placed during the hospital encounter of 08/29/12  POCT I-STAT, CHEM 8      Component Value Range   Sodium 139  135 - 145 mEq/L   Potassium 3.7  3.5 -  5.1 mEq/L   Chloride 100  96 - 112 mEq/L   BUN 6  6 - 23 mg/dL   Creatinine, Ser 4.09  0.50 - 1.35 mg/dL   Glucose, Bld 91  70 - 99 mg/dL   Calcium, Ion 8.11 (*) 1.12 - 1.23 mmol/L   TCO2 27  0 - 100 mmol/L   Hemoglobin 13.6  13.0 - 17.0 g/dL   HCT 91.4  78.2 - 95.6 %   Dg Chest 2 View  08/29/2012  *RADIOLOGY REPORT*  Clinical Data: Cough, fever and emesis.  CHEST - 2 VIEW  Comparison: 05/14/2012.  Findings: Trachea is midline.  Heart size normal.  Lungs are clear. No pleural fluid.  IMPRESSION: No acute findings.   Original Report Authenticated By: Leanna Battles, M.D.      No diagnosis found. Chest x-ray reviewed by me 3:15 PM pain improved since treatment with intravenous fluids. He feels very to home  MDM  Plan encourage oral hydration. Tylenol for aches. Smoking cessation encouraged. Spent 5 minutes counseling patient on smoking. Follow up  Dr.Avbuere if not improving in 5-7 days Diagnosis #1 viral illness #2 mild dehydration       Doug Sou, MD 08/29/12 1520

## 2013-01-31 ENCOUNTER — Emergency Department: Payer: Self-pay | Admitting: Internal Medicine

## 2013-01-31 LAB — DRUG SCREEN, URINE

## 2013-01-31 LAB — CBC
HCT: 45.3 % (ref 40.0–52.0)
HGB: 15.6 g/dL (ref 13.0–18.0)
MCH: 28.5 pg (ref 26.0–34.0)
MCHC: 34.5 g/dL (ref 32.0–36.0)
MCV: 83 fL (ref 80–100)
Platelet: 261 10*3/uL (ref 150–440)
RBC: 5.49 10*6/uL (ref 4.40–5.90)
RDW: 13.4 % (ref 11.5–14.5)
WBC: 10 10*3/uL (ref 3.8–10.6)

## 2013-01-31 LAB — BASIC METABOLIC PANEL
Anion Gap: 7 (ref 7–16)
BUN: 8 mg/dL (ref 7–18)
Calcium, Total: 9.7 mg/dL (ref 8.5–10.1)
Chloride: 103 mmol/L (ref 98–107)
Co2: 29 mmol/L (ref 21–32)
Creatinine: 0.83 mg/dL (ref 0.60–1.30)
EGFR (African American): >60
EGFR (Non-African Amer.): >60
Glucose: 105 mg/dL — ABNORMAL HIGH (ref 65–99)
Osmolality: 276 (ref 275–301)
Potassium: 4.2 mmol/L (ref 3.5–5.1)
Sodium: 139 mmol/L (ref 136–145)

## 2013-01-31 LAB — TROPONIN I: Troponin-I: <0.02 ng/mL

## 2013-05-29 ENCOUNTER — Emergency Department: Payer: Self-pay | Admitting: Emergency Medicine

## 2013-11-08 ENCOUNTER — Emergency Department: Payer: Self-pay | Admitting: Emergency Medicine

## 2014-02-09 ENCOUNTER — Emergency Department: Payer: Self-pay | Admitting: Emergency Medicine

## 2014-04-16 ENCOUNTER — Ambulatory Visit: Payer: Self-pay | Admitting: Internal Medicine

## 2014-10-30 ENCOUNTER — Emergency Department: Payer: Self-pay | Admitting: Emergency Medicine

## 2016-04-15 DIAGNOSIS — F17200 Nicotine dependence, unspecified, uncomplicated: Secondary | ICD-10-CM | POA: Insufficient documentation

## 2016-04-15 DIAGNOSIS — Y9389 Activity, other specified: Secondary | ICD-10-CM | POA: Diagnosis not present

## 2016-04-15 DIAGNOSIS — J45909 Unspecified asthma, uncomplicated: Secondary | ICD-10-CM | POA: Insufficient documentation

## 2016-04-15 DIAGNOSIS — X500XXA Overexertion from strenuous movement or load, initial encounter: Secondary | ICD-10-CM | POA: Insufficient documentation

## 2016-04-15 DIAGNOSIS — Y9289 Other specified places as the place of occurrence of the external cause: Secondary | ICD-10-CM | POA: Insufficient documentation

## 2016-04-15 DIAGNOSIS — E039 Hypothyroidism, unspecified: Secondary | ICD-10-CM | POA: Insufficient documentation

## 2016-04-15 DIAGNOSIS — Y990 Civilian activity done for income or pay: Secondary | ICD-10-CM | POA: Insufficient documentation

## 2016-04-15 DIAGNOSIS — S86811A Strain of other muscle(s) and tendon(s) at lower leg level, right leg, initial encounter: Secondary | ICD-10-CM | POA: Insufficient documentation

## 2016-04-15 DIAGNOSIS — Z79899 Other long term (current) drug therapy: Secondary | ICD-10-CM | POA: Diagnosis not present

## 2016-04-15 DIAGNOSIS — M79604 Pain in right leg: Secondary | ICD-10-CM | POA: Diagnosis present

## 2016-04-15 NOTE — ED Notes (Signed)
Workmans comp UDS completed and walked to lab. 

## 2016-04-15 NOTE — ED Triage Notes (Signed)
Pt to triage via wheelchair. 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 Pt reports having pain in the area since. CMS intact.

## 2016-04-16 ENCOUNTER — Emergency Department: Payer: Medicaid Other

## 2016-04-16 ENCOUNTER — Emergency Department
Admission: EM | Admit: 2016-04-16 | Discharge: 2016-04-16 | Disposition: A | Discharge status: Home / Self Care | Payer: Medicaid Other | Attending: Emergency Medicine | Admitting: Emergency Medicine

## 2016-04-16 DIAGNOSIS — S86111A Strain of other muscle(s) and tendon(s) of posterior muscle group at lower leg level, right leg, initial encounter: Secondary | ICD-10-CM

## 2016-04-16 MED ORDER — OXYCODONE-ACETAMINOPHEN 5-325 MG PO TABS
1.0000 | ORAL_TABLET | Freq: Once | ORAL | Status: AC
  Administered 2016-04-16: 1 via ORAL
  Filled 2016-04-16: qty 1

## 2016-04-16 MED ORDER — NAPROXEN 500 MG PO TABS: 500.0000 mg | ORAL_TABLET | Freq: Two times a day (BID) | ORAL | 0 refills | Status: DC

## 2016-04-16 MED ORDER — OXYCODONE-ACETAMINOPHEN 5-325 MG PO TABS: 1.0000 | ORAL_TABLET | Freq: Four times a day (QID) | ORAL | 0 refills | Status: DC | PRN

## 2016-04-16 NOTE — ED Provider Notes (Signed)
[facility] Emergency Department Provider Note  ____________________________________________  Time seen: Approximately 3:15 AM  I have reviewed the triage vital signs and the nursing notes.   HISTORY  Chief Complaint Leg Pain    HPI [name] is a 25 y.o. male who complains of right calf pain that started suddenly 2 days ago while he was pulling a pile of brush that he estimates weigh between 101 150 pounds. 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 He was pulling the heavy load over to a wood chipper, when he felt a sudden pop sensation with inability to support his weight with the right leg. He fell to the ground. He found it felt unstable. Yesterday it had improved but has continued to be very painful. No coldness or loss of power in the leg. No significant trauma or any penetrating injuries.     Past Medical History:  Diagnosis Date  . Asthma   . Bipolar 1 disorder   . Hypothyroid   . Overdose   . Suicidal thoughts      There are no active problems to display for this patient.    Past Surgical History:  Procedure Laterality Date  . APPENDECTOMY       Prior to Admission medications   Medication Sig Start Date End Date Taking? Authorizing Provider  albuterol (PROVENTIL HFA;VENTOLIN HFA) 108 (90 BASE) MCG/ACT inhaler Inhale 2 puffs into the lungs every 6 (six) hours as needed. For wheezing    Historical Provider, MD  ibuprofen (ADVIL,MOTRIN) 800 MG tablet Take 800 mg by mouth 3 (three) times daily as needed. For pain.    Historical Provider, MD  loratadine (CLARITIN) 10 MG tablet Take 10 mg by mouth daily as needed. For allergies.    Historical Provider, MD  Multiple Vitamin (MULTIVITAMIN WITH MINERALS) TABS Take 1 tablet by mouth daily.    Historical Provider, MD  naproxen (NAPROSYN) 500 MG tablet Take 1 tablet (500 mg total) by mouth 2 (two) times daily with a meal. 04/16/16   Sharman Cheek, MD   oxyCODONE-acetaminophen (ROXICET) 5-325 MG tablet Take 1 tablet by mouth every 6 (six) hours as needed for severe pain. 04/16/16   Sharman Cheek, MD  ranitidine (ZANTAC) 150 MG tablet Take 150 mg by mouth daily as needed. For acid reflux    Historical Provider, MD     Allergies Geodon [ziprasidone hydrochloride] and Haldol [haloperidol lactate]   No family history on file.  Social History Social History  Substance Use Topics  . Smoking status: Current Some Day Smoker  . Smokeless tobacco: Not on file  . Alcohol use No    Review of Systems  Constitutional:   No fever or chills.   Cardiovascular:   No chest pain. Respiratory:   No dyspnea or cough. Gastrointestinal:   Negative for abdominal pain, vomiting and diarrhea.   Musculoskeletal:   Right calf pain as above Neurological:   No weakness 10-point ROS otherwise negative.  ____________________________________________   PHYSICAL EXAM:  VITAL SIGNS: ED Triage Vitals  Enc Vitals Group     BP 04/15/16 2301 128/74     Pulse Rate 04/15/16 2301 87     Resp 04/15/16 2301 18     Temp 04/15/16 2301 97.9 F (36.6 C)     Temp Source 04/15/16 2301 Oral     SpO2 04/15/16 2301 98 %     Weight 04/15/16 2302 174 lb (78.9 kg)  Height 04/15/16 2302 5\' 11"  (1.803 m)     Head Circumference --      Peak Flow --      Pain Score 04/15/16 2302 5     Pain Loc --      Pain Edu? --      Excl. in GC? --     Vital signs reviewed, nursing assessments reviewed.   Constitutional:   Alert and oriented. Well appearing and in no distress.  ENT   Head:   Normocephalic and atraumatic.     Neck:   No stridor. No SubQ emphysema. No meningismus. Hematological/Lymphatic/Immunilogical:   No cervical lymphadenopathy. Gastrointestinal:   Soft and nontender. Non distended.  No rebound, rigidity, or guarding. Genitourinary:   deferred Musculoskeletal:   No bony tenderness. Normal range of motion in all extremities although there  is pain with range of motion of the right knee. Ligaments are stable in the knee. Intact Achilles tendon function, although palpation and manipulation of the foot and distal Achilles tendon causes pain in the proximal gastroc muscle belly. Palpation of the muscle belly also causes severe tenderness particularly in the medial head. Neurologic:   Normal speech and language.  CN 2-10 normal. Motor grossly intact. No gross focal neurologic deficits are appreciated.  Skin:    Skin is warm, dry and intact. No rash noted.  No petechiae, purpura, or bullae. Few small scattered ecchymoses along the right medial gastroc  ____________________________________________    LABS (pertinent positives/negatives) (all labs ordered are listed, but only abnormal results are displayed) Labs Reviewed - No data to display ____________________________________________   EKG    ____________________________________________    RADIOLOGY  X-ray right knee unremarkable X-ray right tib-fib unremarkable  ____________________________________________   PROCEDURES Procedures  ____________________________________________   INITIAL IMPRESSION / ASSESSMENT AND PLAN / ED COURSE  Pertinent labs & imaging results that were available during my care of the patient were reviewed by me and considered in my medical decision making (see chart for details).  Patient presents with pain in the right calf while pulling a heavy weight. Exam clinically is consistent with a partial rupture of the right gastrocnemius particularly the medial head. No compartment syndrome, no fracture or dislocation. Ligaments are stable. Continue crutches, pain control, follow-up with orthopedics.     Clinical Course   ____________________________________________   FINAL CLINICAL IMPRESSION(S) / ED DIAGNOSES  Final diagnoses:  Gastrocnemius strain, right, initial encounter       Portions of this note were generated with dragon  dictation software. Dictation errors may occur despite best attempts at proofreading.    Sharman CheekPhillip Maddyson Keil, MD 04/16/16 [id number]

## 2016-04-16 NOTE — Discharge Instructions (Signed)
You were seen today for sudden calf pain while pulling heavy objects. Your x-rays of your knee and lower leg today are unremarkable. Your examination suggests a partial tear of your calf muscle. Follow-up with orthopedics for further evaluation. Use crutches to keep weight off of the leg but engage in range of motion exercises twice a day as demonstrated in the enclosed instructions.

## 2016-04-16 NOTE — ED Notes (Signed)
  Reviewed d/c instructions, follow-up care, and prescriptions with pt. Pt verbalized understanding 

## 2016-04-16 NOTE — ED Notes (Signed)
Pt tender to palpation of right calf. No area of redness/warmth was seen/felt on palpation

## 2016-04-16 NOTE — ED Notes (Signed)
Pt reports decreased sensation in right lower leg. Upon palpation of right lower leg and dorsal foot, pt states: "I can feel you touching, but it feels lighter than the other foot". Upon palpation of toes pt states: "I can feel pressure, but it feels different, numb". Pt's foot/leg cool to touch. Pedal pulse heard on doppler

## 2016-04-16 NOTE — ED Notes (Addendum)
Pt c/o right lower leg pain beginning Thursday. Pt was dragging brush at work and felt/heard popping right lower leg. Pt's leg has gotten progressively worse since.

## 2016-09-29 ENCOUNTER — Ambulatory Visit (INDEPENDENT_AMBULATORY_CARE_PROVIDER_SITE_OTHER): Payer: Worker's Compensation | Admitting: Orthopedic Surgery

## 2016-09-29 DIAGNOSIS — S46111A Strain of muscle, fascia and tendon of long head of biceps, right arm, initial encounter: Secondary | ICD-10-CM

## 2016-09-29 MED ORDER — TRAMADOL HCL 50 MG PO TABS: 50.0000 mg | ORAL_TABLET | Freq: Four times a day (QID) | ORAL | 0 refills | Status: DC | PRN
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
## 2016-10-03 ENCOUNTER — Telehealth (INDEPENDENT_AMBULATORY_CARE_PROVIDER_SITE_OTHER): Payer: Self-pay | Admitting: *Deleted

## 2016-10-03 NOTE — Telephone Encounter (Signed)
Elizabeth from [employer] called this afternoon in regards to needing a verification on when this patient needs to go back to work. There is some confusion on the dates they are not reflecting the same amount of time out of work? Her CB # [phone]. Ext [phone]. Thank you

## 2016-10-04 NOTE — Telephone Encounter (Signed)
IC LMVM advised per note in pt's chart OOW through October 27, 2016

## 2016-10-04 NOTE — Telephone Encounter (Signed)
Lanora Manislizabeth called again about this.

## 2016-10-05 ENCOUNTER — Telehealth (INDEPENDENT_AMBULATORY_CARE_PROVIDER_SITE_OTHER): Payer: Self-pay | Admitting: Orthopedic Surgery

## 2016-10-05 NOTE — Telephone Encounter (Signed)
I faxed work note to her

## 2016-10-05 NOTE — Telephone Encounter (Signed)
[name] called needing a current work note faxed over for the patient. CB # [phone] Fax # [phone]

## 2016-10-06 ENCOUNTER — Emergency Department
Admission: EM | Admit: 2016-10-06 | Discharge: 2016-10-06 | Disposition: A | Discharge status: Home / Self Care | Payer: Medicaid Other | Attending: Emergency Medicine | Admitting: Emergency Medicine

## 2016-10-06 ENCOUNTER — Encounter: Payer: Self-pay | Admitting: Emergency Medicine

## 2016-10-06 DIAGNOSIS — F418 Other specified anxiety disorders: Secondary | ICD-10-CM | POA: Diagnosis not present

## 2016-10-06 DIAGNOSIS — F1994 Other psychoactive substance use, unspecified with psychoactive substance-induced mood disorder: Secondary | ICD-10-CM

## 2016-10-06 DIAGNOSIS — Z79899 Other long term (current) drug therapy: Secondary | ICD-10-CM | POA: Insufficient documentation

## 2016-10-06 DIAGNOSIS — F909 Attention-deficit hyperactivity disorder, unspecified type: Secondary | ICD-10-CM | POA: Insufficient documentation

## 2016-10-06 DIAGNOSIS — J45909 Unspecified asthma, uncomplicated: Secondary | ICD-10-CM | POA: Diagnosis not present

## 2016-10-06 DIAGNOSIS — F419 Anxiety disorder, unspecified: Secondary | ICD-10-CM

## 2016-10-06 DIAGNOSIS — F319 Bipolar disorder, unspecified: Secondary | ICD-10-CM

## 2016-10-06 DIAGNOSIS — F329 Major depressive disorder, single episode, unspecified: Secondary | ICD-10-CM

## 2016-10-06 DIAGNOSIS — Z046 Encounter for general psychiatric examination, requested by authority: Secondary | ICD-10-CM | POA: Diagnosis present

## 2016-10-06 DIAGNOSIS — Z791 Long term (current) use of non-steroidal anti-inflammatories (NSAID): Secondary | ICD-10-CM | POA: Diagnosis not present

## 2016-10-06 DIAGNOSIS — F32A Depression, unspecified: Secondary | ICD-10-CM

## 2016-10-06 HISTORY — DX: Post-traumatic stress disorder, unspecified: F43.10

## 2016-10-06 HISTORY — DX: Anxiety disorder, unspecified: F41.9

## 2016-10-06 HISTORY — DX: Attention-deficit hyperactivity disorder, unspecified type: F90.9

## 2016-10-06 LAB — URINE DRUG SCREEN, QUALITATIVE (ARMC ONLY)
Amphetamines, Ur Screen: POSITIVE — AB
Barbiturates, Ur Screen: NOT DETECTED
Benzodiazepine, Ur Scrn: NOT DETECTED
Cannabinoid 50 Ng, Ur ~~LOC~~: NOT DETECTED
Cocaine Metabolite,Ur ~~LOC~~: NOT DETECTED
MDMA (Ecstasy)Ur Screen: NOT DETECTED
Methadone Scn, Ur: NOT DETECTED
Opiate, Ur Screen: NOT DETECTED
Phencyclidine (PCP) Ur S: NOT DETECTED
Tricyclic, Ur Screen: NOT DETECTED

## 2016-10-06 LAB — COMPREHENSIVE METABOLIC PANEL
ALT: 17 U/L (ref 17–63)
AST: 19 U/L (ref 15–41)
Albumin: 4.8 g/dL (ref 3.5–5.0)
Alkaline Phosphatase: 98 U/L (ref 38–126)
Anion gap: 6 (ref 5–15)
BUN: 13 mg/dL (ref 6–20)
CO2: 31 mmol/L (ref 22–32)
Calcium: 9.4 mg/dL (ref 8.9–10.3)
Chloride: 102 mmol/L (ref 101–111)
Creatinine, Ser: 1.09 mg/dL (ref 0.61–1.24)
GFR calc Af Amer: >60 mL/min (ref >60)
GFR calc non Af Amer: >60 mL/min (ref >60)
Glucose, Bld: 107 mg/dL — ABNORMAL HIGH (ref 65–99)
Potassium: 3.8 mmol/L (ref 3.5–5.1)
Sodium: 139 mmol/L (ref 135–145)
Total Bilirubin: 0.6 mg/dL (ref 0.3–1.2)
Total Protein: 7.6 g/dL (ref 6.5–8.1)

## 2016-10-06 LAB — CBC
HCT: 43 % (ref 40.0–52.0)
Hemoglobin: 15.3 g/dL (ref 13.0–18.0)
MCH: 29.5 pg (ref 26.0–34.0)
MCHC: 35.5 g/dL (ref 32.0–36.0)
MCV: 83.1 fL (ref 80.0–100.0)
Platelets: 244 10*3/uL (ref 150–440)
RBC: 5.18 MIL/uL (ref 4.40–5.90)
RDW: 13.2 % (ref 11.5–14.5)
WBC: 7.6 10*3/uL (ref 3.8–10.6)

## 2016-10-06 LAB — ACETAMINOPHEN LEVEL: Acetaminophen (Tylenol), Serum: <10 ug/mL — ABNORMAL LOW (ref 10–30)

## 2016-10-06 LAB — ETHANOL: Alcohol, Ethyl (B): <5 mg/dL (ref <5)

## 2016-10-06 LAB — SALICYLATE LEVEL: Salicylate Lvl: <7 mg/dL (ref 2.8–30.0)

## 2016-10-06 MED ORDER — QUETIAPINE FUMARATE 100 MG PO TABS: 100.0000 mg | ORAL_TABLET | Freq: Every day | ORAL | 0 refills | Status: DC

## 2016-10-06 MED ORDER — NICOTINE 21 MG/24HR TD PT24
21.0000 mg | MEDICATED_PATCH | Freq: Once | TRANSDERMAL | Status: DC
  Administered 2016-10-06: 21 mg via TRANSDERMAL
  Filled 2016-10-06: qty 1

## 2016-10-06 MED ORDER — ACETAMINOPHEN 500 MG PO TABS
1000.0000 mg | ORAL_TABLET | Freq: Once | ORAL | Status: DC
Start: 2016-10-06 — End: 2016-10-06

## 2016-10-06 MED ORDER — IBUPROFEN 600 MG PO TABS
600.0000 mg | ORAL_TABLET | Freq: Once | ORAL | Status: AC
  Administered 2016-10-06: 600 mg via ORAL
  Filled 2016-10-06: qty 1
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
## 2016-10-06 NOTE — ED Notes (Signed)
Pt talking loud from hallway bed  "I need my vape right now - If I don't get my vape then I am ready to go - turn on some damn music - this is too quiet."   MD back to bedside for evaluation  Nicotine patch ordered  - EDP verbalizes that the per pt request to give him one piece of gum   Gum provided

## 2016-10-06 NOTE — ED Notes (Signed)
BEHAVIORAL HEALTH ROUNDING Patient sleeping: No. Patient alert and oriented: yes Behavior appropriate: Yes.  ; If no, describe:  Nutrition and fluids offered: yes Toileting and hygiene offered: Yes  Sitter present: q15 minute observations and security  monitoring Law enforcement present: Yes  ODS  

## 2016-10-06 NOTE — ED Notes (Signed)

## 2016-10-06 NOTE — ED Notes (Addendum)
Pt dressed out with this [employer] and [place] officer in the room. Pt belongings consisted of blue tennis shoes, black socks, blue jeans with a set of keys connected on a belt loop, black t-shirt, brown belt, blue boxers and a black bag with cell phone, wallet, earphones, vape cigarette and a cell phone charger.

## 2016-10-06 NOTE — ED Notes (Addendum)
Pt reports suicidal thoughts earlier today - plan was to walk in front of the train   Pt anxious and demanding during assessment

## 2016-10-06 NOTE — ED Triage Notes (Addendum)
Says he needs help.  Says he just doesn't care about anything anymore.  When asked about suicidal idation he says "i dont know"  Says he is angry at himself.   Has sling on right arm.  Says he is waiting for worker comp so he can have surgery, but he has been unable to sleep for about a week.

## 2016-10-06 NOTE — ED Provider Notes (Signed)
[facility] Emergency Department Provider Note  ____________________________________________   None    (approximate)  I have reviewed the triage vital signs and the nursing notes.   HISTORY  Chief Complaint Psychiatric Evaluation    HPI Adam MerinoJoshua D Marsh is a 26 y.o. male who self presents to the ED requesting psychiatric help. He has a past medical history of bipolar, PTSD, obsessive-compulsive disorder, anxiety disorder, and ADHD but is not seen a psychiatrist in several years. This morning he said he "blacked out" and kicked his His Fiance Told Him That He Would Either Come to the Hospital Today to Get Help or She Was Going to Leave Him.  He says he has not had a good night sleep in the last 10 days sleeping a total of 10-12 hours over that time. She says that at times he wants to die and that he would jump in front of the train in order to end it all. He has not tried to kill himself. Denies chest pain shortness of breath abdominal pain nausea vomiting. He denies drug or alcohol use.   Past Medical History:  Diagnosis Date  . ADHD   . Anxiety   . Asthma   . Bipolar 1 disorder (HCC)   . Overdose   . PTSD (post-traumatic stress disorder)   . Suicidal thoughts     Patient Active Problem List   Diagnosis Date Noted  . Substance induced mood disorder (HCC) 10/06/2016  . Bipolar 1 disorder (HCC) 10/06/2016    Past Surgical History:  Procedure Laterality Date  . APPENDECTOMY      Prior to Admission medications   Medication Sig Start Date End Date Taking? Authorizing Provider  albuterol (PROVENTIL HFA;VENTOLIN HFA) 108 (90 BASE) MCG/ACT inhaler Inhale 2 puffs into the lungs every 6 (six) hours as needed. For wheezing    Historical Provider, MD  ibuprofen (ADVIL,MOTRIN) 800 MG tablet Take 800 mg by mouth 3 (three) times daily as needed. For pain.    Historical Provider, MD  loratadine (CLARITIN) 10 MG tablet Take 10 mg by mouth daily as needed.  For allergies.    Historical Provider, MD  Multiple Vitamin (MULTIVITAMIN WITH MINERALS) TABS Take 1 tablet by mouth daily.    Historical Provider, MD  naproxen (NAPROSYN) 500 MG tablet Take 1 tablet (500 mg total) by mouth 2 (two) times daily with a meal. 04/16/16   Sharman CheekPhillip Stafford, MD  oxyCODONE-acetaminophen (ROXICET) 5-325 MG tablet Take 1 tablet by mouth every 6 (six) hours as needed for severe pain. 04/16/16   Sharman CheekPhillip Stafford, MD  QUEtiapine (SEROQUEL) 100 MG tablet Take 1 tablet (100 mg total) by mouth at bedtime. 10/06/16   Audery AmelJohn T Clapacs, MD  ranitidine (ZANTAC) 150 MG tablet Take 150 mg by mouth daily as needed. For acid reflux    Historical Provider, MD  traMADol (ULTRAM) 50 MG tablet Take 1 tablet (50 mg total) by mouth every 6 (six) hours as needed. 09/29/16   Cammy CopaScott Gregory Dean, MD    Allergies Geodon [ziprasidone hydrochloride] and Haldol [haloperidol lactate]  No family history on file.  Social History Social History  Substance Use Topics  . Smoking status: Former Games developermoker  . Smokeless tobacco: Never Used     Comment: vapes  . Alcohol use No    Review of Systems Constitutional: No fever/chills Eyes: No visual changes. ENT: No sore throat. Cardiovascular: Denies chest pain. Respiratory: Denies shortness of breath. Gastrointestinal: No abdominal pain.  No nausea, no vomiting.  No diarrhea.  No constipation. Genitourinary: Negative for dysuria. Musculoskeletal: Right shoulder pain Skin: Negative for rash. Neurological: Negative for headaches, focal weakness or numbness. Psychiatric: Depression 10-point ROS otherwise negative.  ____________________________________________   PHYSICAL EXAM:  VITAL SIGNS: ED Triage Vitals  Enc Vitals Group     BP 10/06/16 0810 137/83     Pulse Rate 10/06/16 0810 100     Resp 10/06/16 0810 16     Temp 10/06/16 0810 98 F (36.7 C)     Temp Source 10/06/16 0810 Oral     SpO2 10/06/16 0810 95 %     Weight 10/06/16 0811 175 lb (79.4  kg)     Height 10/06/16 0811 5\' 11"  (1.803 m)     Head Circumference --      Peak Flow --      Pain Score 10/06/16 0811 6     Pain Loc --      Pain Edu? --      Excl. in GC? --     Constitutional: Alert and oriented 4 appears frustrated with sad affect Eyes: Conjunctivae are normal. PERRL. EOMI. Head: Atraumatic. Nose: No congestion/rhinnorhea. Mouth/Throat: Mucous membranes are moist.  Oropharynx non-erythematous. Neck: No stridor.   Cardiovascular: Normal rate, regular rhythm. Grossly normal heart sounds.  Good peripheral circulation. Respiratory: Normal respiratory effort.  No retractions. Lungs CTAB. Gastrointestinal: Soft and nontender. No distention. No abdominal bruits. No CVA tenderness. Musculoskeletal: Right shoulder in sling Neurologic:  Normal speech and language. No gross focal neurologic deficits are appreciated. No gait instability. Skin:  Skin is warm, dry and intact. No rash noted. Psychiatric: Sad affect  ____________________________________________   LABS (all labs ordered are listed, but only abnormal results are displayed)  Labs Reviewed  COMPREHENSIVE METABOLIC PANEL - Abnormal; Notable for the following:       Result Value   Glucose, Bld 107 (*)    All other components within normal limits  ACETAMINOPHEN LEVEL - Abnormal; Notable for the following:    Acetaminophen (Tylenol), Serum <10 (*)    All other components within normal limits  URINE DRUG SCREEN, QUALITATIVE ([facility] ONLY) - Abnormal; Notable for the following:    Amphetamines, Ur Screen POSITIVE (*)    All other components within normal limits  ETHANOL  SALICYLATE LEVEL  CBC  Amphetamine positive but he is taking ADHD medication ____________________________________________  EKG  ____________________________________________  RADIOLOGY   ____________________________________________   PROCEDURES  Procedure(s) performed: no  Procedures  Critical Care performed:  no  ____________________________________________   INITIAL IMPRESSION / ASSESSMENT AND PLAN / ED COURSE  Pertinent labs & imaging results that were available during my care of the patient were reviewed by me and considered in my medical decision making (see chart for details).  On arrival the patient was sad appearing with a concerning story for her suicidality. I'll check general labs and reevaluate the patient as well as touch base with psychiatry.      ____________________________________________   FINAL CLINICAL IMPRESSION(S) / ED DIAGNOSES  Final diagnoses:  Depression, unspecified depression type  Anxiety      NEW MEDICATIONS STARTED DURING THIS VISIT:  Discharge Medication List as of 10/06/2016 12:40 PM       Note:  This document was prepared using Dragon voice recognition software and may include unintentional dictation errors.     Merrily Brittle, MD 10/07/16 1535

## 2016-10-06 NOTE — Consult Note (Signed)
[facility] Consult   Reason for Consult:  Consult for 26 year old man who came to the emergency room complaining of mood instability Referring Physician:  Corky Marsh Patient Identification: [name] MRN:  [id number] Principal Diagnosis: Substance induced mood disorder [facility]) Diagnosis:   Patient Active Problem List   Diagnosis Date Noted  . Substance induced mood disorder ([place]) [F19.94] 10/06/2016  . Bipolar 1 disorder ([place]) [F31.9] 10/06/2016    Total Time spent with patient: 1 hour  Subjective:   [name] is a 26 y.o. male patient admitted with "I'm having mood swings and I'm not sleeping".  HPI:  Patient interviewed. Chart reviewed. 26 year old man presented himself to the emergency room saying that he has not been able to sleep more than a couple hours a night in days and that he is feeling like his mood is unstable. He says that this morning he was unable to fall asleep because the cat was making noise in the house so he got up and kicked the cat. His fiance got angry with him and insisted that he come into the hospital for some kind of help. Patient says his mood is always labile but it has been worse for the last couple weeks. He also says he only sleeps one or 2 hours a night. He denies having any current suicidal thoughts although when he first came into the emergency room he mentioned some passive suicidality to some of the intake providers. He denies any homicidal ideation. He denies any psychotic symptoms. He is currently on Vyvanse 50 mg a day from his primary care doctor but not any other psychiatry medicine. Denies that he is abusing alcohol or any other drugs. Other stresses include pain in his shoulder which she says is keeping him up at night. Apparently he injured his shoulder at work a few weeks ago and he is still having pain. Also he says he is frustrated with trying to get Workmen's Comp. to pay out.  Social history: Currently living with a  girlfriend. They live in [place]. 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  Medical history: Acute soft tissue injury of the right shoulder several weeks ago that he claims happened on a job site. No other known ongoing medical issues.  Substance abuse history: Denies that he drinks or abuses any drugs. Currently taking Vyvanse 50 mg a day. Denies that he is misusing it.  Past Psychiatric History: He has had one psychiatric hospitalization before several years ago and had a suicide attempt by overdose at that time. He followed up at some time with outpatient providers who were prescribing Depakote for him but he didn't like it. He can't remember any other medicines he has been prescribed in the past. Denies any history of psychotic symptoms. He says that bipolar disorder has been mentioned as a possible concern  Risk to Self: Suicidal Ideation: No-Not Currently/Within Last 6 Months Suicidal Intent: No Is patient at risk for suicide?: No Suicidal Plan?: No Access to Means: No What has been your use of drugs/alcohol within the last 12 months?: none How many times?:  (Multiple, UTA specifics ) Other Self Harm Risks: none  Triggers for Past Attempts: Other (Comment) (Lack of sleep ) Intentional Self Injurious Behavior: Cutting (history- in the distanct past ) Comment - Self Injurious Behavior: Cutting as a young adult Risk to Others: Homicidal Ideation: No Thoughts of Harm to Others: No Current Homicidal Intent: No Current Homicidal Plan: No  Access to Homicidal Means: No Identified Victim: N/A History of harm to others?: Yes (Pt reprots he kicked the famiy cat today ) Assessment of Violence: In past 6-12 months Violent Behavior Description: Pt reprots he kicked the famiy cat today  Does patient have access to weapons?: No Criminal Charges Pending?: No Does patient have a court date: No Prior Inpatient Therapy: Prior Inpatient Therapy: Yes Prior  Therapy Dates: UTA [facility] 34 [facility] 41 Reason for Treatment: Bipolar Disorder Prior Outpatient Therapy: Prior Outpatient Therapy: No Prior Therapy Dates: none Prior Therapy Facilty/Provider(s): none Reason for Treatment: none Does patient have an ACCT team?: No Does patient have Intensive In-House Services?  : No Does patient have [facility] services? : No Does patient have P4CC services?: No  Past Medical History:  Past Medical History:  Diagnosis Date  . ADHD   . Anxiety   . Asthma   . Bipolar 1 disorder ([place])   . Overdose   . PTSD (post-traumatic stress disorder)   . Suicidal thoughts     Past Surgical History:  Procedure Laterality Date  . APPENDECTOMY     Family History: No family history on file. Family Psychiatric  History: He says that his mother had depression no family history of suicide Social History:  History  Alcohol Use No     History  Drug use: Unknown    Social History   Social History  . Marital status: Single    Spouse name: N/A  . Number of children: N/A  . Years of education: N/A   Social History Main Topics  . Smoking status: Former Research scientist (life sciences)  . Smokeless tobacco: Never Used     Comment: vapes  . Alcohol use No  . Drug use: Unknown  . Sexual activity: Not Asked   Other Topics Concern  . None   Social History Narrative  . None   Additional Social History:    Allergies:   Allergies  Allergen Reactions  . Geodon [Ziprasidone Hydrochloride] Other (See Comments)    See things  . Haldol [Haloperidol Lactate] Other (See Comments)    "Muscles lock up"    Labs:  Results for orders placed or performed during the hospital encounter of 10/06/16 (from the past 48 hour(s))  Comprehensive metabolic panel     Status: Abnormal   Collection Time: 10/06/16  8:17 AM  Result Value Ref Range   Sodium 139 135 - 145 mmol/L   Potassium 3.8 3.5 - 5.1 mmol/L   Chloride 102 101 - 111 mmol/L    CO2 31 22 - 32 mmol/L   Glucose, Bld 107 (H) 65 - 99 mg/dL   BUN 13 6 - 20 mg/dL   Creatinine, Ser 1.09 0.61 - 1.24 mg/dL   Calcium 9.4 8.9 - 10.3 mg/dL   Total Protein 7.6 6.5 - 8.1 g/dL   Albumin 4.8 3.5 - 5.0 g/dL   AST 19 15 - 41 U/L   ALT 17 17 - 63 U/L   Alkaline Phosphatase 98 38 - 126 U/L   Total Bilirubin 0.6 0.3 - 1.2 mg/dL   GFR calc non Af Amer >60 >60 mL/min   GFR calc Af Amer >60 >60 mL/min    Comment: (NOTE) The eGFR has been calculated using the CKD EPI equation. This calculation has not been validated in all clinical situations. eGFR's persistently <60 mL/min signify possible Chronic Kidney Disease.    Anion gap 6 5 - 15  Ethanol  Status: None   Collection Time: 10/06/16  8:17 AM  Result Value Ref Range   Alcohol, Ethyl (B) <5 <5 mg/dL    Comment:        LOWEST DETECTABLE LIMIT FOR SERUM ALCOHOL IS 5 mg/dL FOR MEDICAL PURPOSES ONLY   Salicylate level     Status: None   Collection Time: 10/06/16  8:17 AM  Result Value Ref Range   Salicylate Lvl <0.9 2.8 - 30.0 mg/dL  Acetaminophen level     Status: Abnormal   Collection Time: 10/06/16  8:17 AM  Result Value Ref Range   Acetaminophen (Tylenol), Serum <10 (L) 10 - 30 ug/mL    Comment:        THERAPEUTIC CONCENTRATIONS VARY SIGNIFICANTLY. A RANGE OF 10-30 ug/mL MAY BE AN EFFECTIVE CONCENTRATION FOR MANY PATIENTS. HOWEVER, SOME ARE BEST TREATED AT CONCENTRATIONS OUTSIDE THIS RANGE. ACETAMINOPHEN CONCENTRATIONS >150 ug/mL AT 4 HOURS AFTER INGESTION AND >50 ug/mL AT 12 HOURS AFTER INGESTION ARE OFTEN ASSOCIATED WITH TOXIC REACTIONS.   cbc     Status: None   Collection Time: 10/06/16  8:17 AM  Result Value Ref Range   WBC 7.6 3.8 - 10.6 K/uL   RBC 5.18 4.40 - 5.90 MIL/uL   Hemoglobin 15.3 13.0 - 18.0 g/dL   HCT 43.0 40.0 - 52.0 %   MCV 83.1 80.0 - 100.0 fL   MCH 29.5 26.0 - 34.0 pg   MCHC 35.5 32.0 - 36.0 g/dL   RDW 13.2 11.5 - 14.5 %   Platelets 244 150 - 440 K/uL  Urine Drug Screen,  Qualitative     Status: Abnormal   Collection Time: 10/06/16  8:17 AM  Result Value Ref Range   Tricyclic, Ur Screen NONE DETECTED NONE DETECTED   Amphetamines, Ur Screen POSITIVE (A) NONE DETECTED   MDMA (Ecstasy)Ur Screen NONE DETECTED NONE DETECTED   Cocaine Metabolite,Ur [place] NONE DETECTED NONE DETECTED   Opiate, Ur Screen NONE DETECTED NONE DETECTED   Phencyclidine (PCP) Ur S NONE DETECTED NONE DETECTED   Cannabinoid 50 Ng, Ur [place] NONE DETECTED NONE DETECTED   Barbiturates, Ur Screen NONE DETECTED NONE DETECTED   Benzodiazepine, Ur Scrn NONE DETECTED NONE DETECTED   Methadone Scn, Ur NONE DETECTED NONE DETECTED    Comment: (NOTE) 735  Tricyclics, urine               Cutoff 1000 ng/mL 200  Amphetamines, urine             Cutoff 1000 ng/mL 300  MDMA (Ecstasy), urine           Cutoff 500 ng/mL 400  Cocaine Metabolite, urine       Cutoff 300 ng/mL 500  Opiate, urine                   Cutoff 300 ng/mL 600  Phencyclidine (PCP), urine      Cutoff 25 ng/mL 700  Cannabinoid, urine              Cutoff 50 ng/mL 800  Barbiturates, urine             Cutoff 200 ng/mL 900  Benzodiazepine, urine           Cutoff 200 ng/mL 1000 Methadone, urine                Cutoff 300 ng/mL 1100 1200 The urine drug screen provides only a preliminary, unconfirmed 1300 analytical test result and should not be used for non-medical 1400  purposes. Clinical consideration and professional judgment should 1500 be applied to any positive drug screen result due to possible 1600 interfering substances. A more specific alternate chemical method 1700 must be used in order to obtain a confirmed analytical result.  1800 Gas chromato graphy / mass spectrometry (GC/MS) is the preferred 1900 confirmatory method.     Current Facility-Administered Medications  Medication Dose Route Frequency Provider Last Rate Last Dose  . acetaminophen (TYLENOL) tablet 1,000 mg  1,000 mg Oral Once Darel Hong, MD      . nicotine (NICODERM  CQ - dosed in mg/24 hours) patch 21 mg  21 mg Transdermal Once Darel Hong, MD   21 mg at 10/06/16 1046   Current Outpatient Prescriptions  Medication Sig Dispense Refill  . albuterol (PROVENTIL HFA;VENTOLIN HFA) 108 (90 BASE) MCG/ACT inhaler Inhale 2 puffs into the lungs every 6 (six) hours as needed. For wheezing    . ibuprofen (ADVIL,MOTRIN) 800 MG tablet Take 800 mg by mouth 3 (three) times daily as needed. For pain.    Marland Kitchen loratadine (CLARITIN) 10 MG tablet Take 10 mg by mouth daily as needed. For allergies.    . Multiple Vitamin (MULTIVITAMIN WITH MINERALS) TABS Take 1 tablet by mouth daily.    . naproxen (NAPROSYN) 500 MG tablet Take 1 tablet (500 mg total) by mouth 2 (two) times daily with a meal. 20 tablet 0  . oxyCODONE-acetaminophen (ROXICET) 5-325 MG tablet Take 1 tablet by mouth every 6 (six) hours as needed for severe pain. 12 tablet 0  . QUEtiapine (SEROQUEL) 100 MG tablet Take 1 tablet (100 mg total) by mouth at bedtime. 30 tablet 0  . ranitidine (ZANTAC) 150 MG tablet Take 150 mg by mouth daily as needed. For acid reflux    . traMADol (ULTRAM) 50 MG tablet Take 1 tablet (50 mg total) by mouth every 6 (six) hours as needed. 30 tablet 0    Musculoskeletal: Strength & Muscle Tone: within normal limits Gait & Station: normal Patient leans: N/A  Psychiatric Specialty Exam: Physical Exam  Nursing note and vitals reviewed. Constitutional: He appears well-developed and well-nourished.  HENT:  Head: Normocephalic and atraumatic.  Eyes: Conjunctivae are normal. Pupils are equal, round, and reactive to light.  Neck: Normal range of motion.  Cardiovascular: Normal heart sounds.   Respiratory: Effort normal.  GI: Soft.  Musculoskeletal: Normal range of motion.       Arms: Neurological: He is alert.  Skin: Skin is warm and dry.  Psychiatric: His behavior is normal. His affect is labile. His speech is rapid and/or pressured. Thought content is not paranoid. Cognition and memory  are normal. He expresses impulsivity. He expresses no homicidal and no suicidal ideation.    Review of Systems  Constitutional: Negative.   HENT: Negative.   Eyes: Negative.   Respiratory: Negative.   Cardiovascular: Negative.   Gastrointestinal: Negative.   Musculoskeletal: Positive for joint pain.  Skin: Negative.   Neurological: Negative.   Psychiatric/Behavioral: Positive for suicidal ideas. Negative for depression, hallucinations, memory loss and substance abuse. The patient is nervous/anxious and has insomnia.     Blood pressure 137/83, pulse 100, temperature 98 F (36.7 C), temperature source Oral, resp. rate 16, height _0  (1.803 m), weight 79.4 kg (175 lb), SpO2 95 %.Body mass index is 24.41 kg/m.  General Appearance: Fairly Groomed  Eye Contact:  Good  Speech:  Clear and Coherent  Volume:  Increased  Mood:  Irritable  Affect:  Labile  Thought Process:  Goal Directed  Orientation:  Full (Time, Place, and Person)  Thought Content:  Logical  Suicidal Thoughts:  No  Homicidal Thoughts:  No  Memory:  Immediate;   Good Recent;   Good Remote;   Fair  Judgement:  Impaired  Insight:  Shallow  Psychomotor Activity:  Restlessness  Concentration:  Concentration: Fair  Recall:  [employer] of Knowledge:  Fair  Language:  Fair  Akathisia:  No  Handed:  Right  AIMS (if indicated):     Assets:  Communication Skills Desire for Improvement Housing Physical Health Resilience  ADL's:  Intact  Cognition:  WNL  Sleep:        Treatment Plan Summary: Medication management and Plan 26 year old man who presents with complaints of poor sleep and irritability. Although he is slightly pressured in his speech he does not come across as obviously manic. He has been cooperative and been able to sit still and cooperate with interviews during his time in the emergency room. I confirmed that he is being prescribed Vyvanse which accounts for his positive drug screen for amphetamines.  Patient advised that if he is having anger problems and poor sleep he should discontinue the use of Vyvanse or any other amphetamines. Furthermore I suggested I give him a prescription for Seroquel 100 mg at night to start to help with his sleep and mood. He will be referred to outpatient resources in the appropriate community. Psychoeducation completed. Case reviewed with emergency room physician. No need for IVC.  Disposition: Patient does not meet criteria for psychiatric inpatient admission. Supportive therapy provided about ongoing stressors.  Alethia Berthold, MD 10/06/2016 12:57 PM

## 2016-10-06 NOTE — ED Provider Notes (Signed)
Cleared for discharge by Dr. Toni Amendlapacs of psychiatry   Jene Everyobert Lindsay Soulliere, MD 10/06/16 1236

## 2016-10-06 NOTE — ED Notes (Signed)
Per request of Dr. Toni Amendlapacs, writer provided the pt. with information and instructions on how to access Outpatient Mental Health [facility] Substance Abuse Treatment ([facility] )   Patient denies SI/HI and AV/H.

## 2016-10-12 ENCOUNTER — Telehealth (INDEPENDENT_AMBULATORY_CARE_PROVIDER_SITE_OTHER): Payer: Self-pay | Admitting: *Deleted

## 2016-10-12 NOTE — Telephone Encounter (Signed)
Received fax from [facility] [employer] needing a faxed prescription sent to [phone] so can expedite scheduling process for MRI, orders has been faxed.

## 2016-10-20 ENCOUNTER — Ambulatory Visit
Admission: RE | Admit: 2016-10-20 | Discharge: 2016-10-20 | Disposition: A | Discharge status: Home / Self Care | Payer: Worker's Compensation | Source: Ambulatory Visit | Attending: Orthopedic Surgery | Admitting: Orthopedic Surgery

## 2016-10-20 DIAGNOSIS — S46111A Strain of muscle, fascia and tendon of long head of biceps, right arm, initial encounter: Secondary | ICD-10-CM

## 2016-10-20 MED ORDER — IOPAMIDOL (ISOVUE-M 200) INJECTION 41%
18.0000 mL | Freq: Once | INTRAMUSCULAR | Status: AC
  Administered 2016-10-20: 18 mL via INTRA_ARTICULAR

## 2016-10-21 ENCOUNTER — Telehealth (INDEPENDENT_AMBULATORY_CARE_PROVIDER_SITE_OTHER): Payer: Self-pay | Admitting: Orthopedic Surgery

## 2016-10-21 NOTE — Telephone Encounter (Signed)
Pt requesting [facility] results [facility] he wants to know if he needs surgery.  [phone]

## 2016-10-24 ENCOUNTER — Telehealth (INDEPENDENT_AMBULATORY_CARE_PROVIDER_SITE_OTHER): Payer: Self-pay | Admitting: Orthopedic Surgery

## 2016-10-24 NOTE — Telephone Encounter (Signed)
Results request.

## 2016-10-24 NOTE — Telephone Encounter (Signed)
I called.  Scan normal.  I'll see him Wednesday likely release him to regular duty on Thursday

## 2016-10-24 NOTE — Telephone Encounter (Signed)
Patient called asked if he can get a call back concerning his MRI. Patient want to know the results prior to his appointment. The number to contact patient is [phone]

## 2016-10-24 NOTE — Telephone Encounter (Signed)
Pt called again for MRI results

## 2016-10-25 ENCOUNTER — Telehealth (INDEPENDENT_AMBULATORY_CARE_PROVIDER_SITE_OTHER): Payer: Self-pay | Admitting: *Deleted

## 2016-10-25 NOTE — Telephone Encounter (Signed)
We have received MRI report.

## 2016-10-25 NOTE — Telephone Encounter (Signed)
[name] from [employer] work[employer]'s [employer] company called this morning in regards to needing to know if we received the report from his MRI?  And if Dr. August Saucerean has not received it Her CB # [phone]. Thank you

## 2016-10-26 ENCOUNTER — Ambulatory Visit (INDEPENDENT_AMBULATORY_CARE_PROVIDER_SITE_OTHER): Payer: Worker's Compensation | Admitting: Orthopedic Surgery

## 2016-10-26 ENCOUNTER — Encounter (INDEPENDENT_AMBULATORY_CARE_PROVIDER_SITE_OTHER): Payer: Self-pay | Admitting: Orthopedic Surgery

## 2016-10-26 DIAGNOSIS — M25511 Pain in right shoulder: Secondary | ICD-10-CM | POA: Insufficient documentation

## 2016-10-26 NOTE — Progress Notes (Signed)
   Office Visit Note   Patient: Adam MerinoJoshua D Marsh           Date of Birth: 03-05-1991           MRN: [id number] Visit Date: 10/26/2016 Requested by: Fleet ContrasEdwin Avbuere, MD [address] [place], [place] [id number] PCP: Dorrene GermanEdwin A Avbuere, MD  Subjective: Chief Complaint  Patient presents with  . 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  MRI scan is reviewed today.  No structural abnormalities in the rotator cuff labrum and biceps or acromioclavicular joint.  In general he's doing better.  He's been very active in doing physical therapy and doing a home exercise program.  At work he has to climb as well as potentially lift less than or equal to 50 pounds.              ROS: All systems reviewed are negative as they relate to the chief complaint within the history of present illness.  Patient denies  fevers or chills.      Assessment & Plan: Visit Diagnoses:  1. Acute pain of right shoulder     Plan: Impression is right shoulder strain with no step definite structural problem on MRI scan.  I think he has excellent strength and range of motion today.  I'm let him return to work today with no restrictions.  Follow-up with me as needed  Follow-Up Instructions: Return if symptoms worsen or fail to improve.   Orders:  No orders of the defined types were placed in this encounter.  No orders of the defined types were placed in this encounter.     Procedures: No procedures performed   Clinical Data: No additional findings.  Objective: Vital Signs: There were no vitals taken for this visit.  Physical Exam:   Constitutional: Patient appears well-developed HEENT:  Head: Normocephalic Eyes:EOM are normal Neck: Normal range of motion Cardiovascular: Normal rate Pulmonary/chest: Effort normal Neurologic: Patient is alert Skin: Skin is warm Psychiatric: Patient has normal mood and affect    Ortho Exam: Orthopedic  exam demonstrates full active and passive range of motion of the right shoulder negative apprehension relocation testing negative O'Brien's testing no course grinding or crepitus with internal/external rotation of the arm excellent rotator cuff strength to infraspinatus super space and subscap muscle testing.  Specialty Comments:  No specialty comments available.  Imaging: No results found.   PMFS History: Patient Active Problem List   Diagnosis Date Noted  . Acute pain of right shoulder 10/26/2016  . Substance induced mood disorder (HCC) 10/06/2016  . Bipolar 1 disorder (HCC) 10/06/2016   Past Medical History:  Diagnosis Date  . ADHD   . Anxiety   . Asthma   . Bipolar 1 disorder (HCC)   . Overdose   . PTSD (post-traumatic stress disorder)   . Suicidal thoughts     No family history on file.  Past Surgical History:  Procedure Laterality Date  . APPENDECTOMY     Social History   Occupational History  . Not on file.   Social History Main Topics  . Smoking status: Former Games developermoker  . Smokeless tobacco: Never Used     Comment: vapes  . Alcohol use No  . Drug use: Unknown  . Sexual activity: Not on file

## 2016-10-27 ENCOUNTER — Telehealth (INDEPENDENT_AMBULATORY_CARE_PROVIDER_SITE_OTHER): Payer: Self-pay | Admitting: Orthopedic Surgery

## 2016-10-27 ENCOUNTER — Telehealth (INDEPENDENT_AMBULATORY_CARE_PROVIDER_SITE_OTHER): Payer: Self-pay | Admitting: *Deleted

## 2016-10-27 NOTE — Telephone Encounter (Signed)
[facility] and last office note. FAX:[phone]

## 2016-10-27 NOTE — Telephone Encounter (Signed)
ERROR

## 2016-10-27 NOTE — Telephone Encounter (Signed)
See other msg on this one as well? Thanks

## 2016-10-27 NOTE — Telephone Encounter (Signed)
Sending to you since there are duplicate requests??  This is WC.  Can you forward appropriately?  Thanks

## 2016-10-27 NOTE — Telephone Encounter (Signed)
See note/other msg, thanks

## 2016-10-27 NOTE — Telephone Encounter (Signed)
faxed

## 2016-10-27 NOTE — Telephone Encounter (Signed)
Adam PhenixElizabeth Marsh (case manager)  with [employer] called needing the work note and office note faxed to her. The fax # is [phone]  The ph# is [phone]

## 2016-12-31 ENCOUNTER — Other Ambulatory Visit: Payer: Self-pay | Admitting: Psychiatry

## 2017-06-24 ENCOUNTER — Encounter: Payer: Self-pay | Admitting: Emergency Medicine

## 2017-06-24 ENCOUNTER — Emergency Department
Admission: EM | Admit: 2017-06-24 | Discharge: 2017-06-24 | Disposition: A | Discharge status: Home / Self Care | Payer: Worker's Compensation | Attending: Emergency Medicine | Admitting: Emergency Medicine

## 2017-06-24 ENCOUNTER — Emergency Department: Payer: Worker's Compensation

## 2017-06-24 DIAGNOSIS — M25531 Pain in right wrist: Secondary | ICD-10-CM | POA: Diagnosis present

## 2017-06-24 DIAGNOSIS — Z87891 Personal history of nicotine dependence: Secondary | ICD-10-CM | POA: Diagnosis not present

## 2017-06-24 DIAGNOSIS — J45909 Unspecified asthma, uncomplicated: Secondary | ICD-10-CM | POA: Diagnosis not present

## 2017-06-24 DIAGNOSIS — F909 Attention-deficit hyperactivity disorder, unspecified type: Secondary | ICD-10-CM | POA: Diagnosis not present

## 2017-06-24 DIAGNOSIS — Z79899 Other long term (current) drug therapy: Secondary | ICD-10-CM | POA: Insufficient documentation

## 2017-06-24 MED ORDER — TRAMADOL HCL 50 MG PO TABS
50.0000 mg | ORAL_TABLET | Freq: Once | ORAL | Status: AC
  Administered 2017-06-24: 50 mg via ORAL
  Filled 2017-06-24: qty 1

## 2017-06-24 MED ORDER — MELOXICAM 7.5 MG PO TABS: 7.5000 mg | ORAL_TABLET | Freq: Every day | ORAL | 1 refills | Status: AC

## 2017-06-24 MED ORDER — TRAMADOL HCL 50 MG PO TABS: 50.0000 mg | ORAL_TABLET | Freq: Four times a day (QID) | ORAL | 0 refills | Status: AC | PRN

## 2017-06-24 NOTE — ED Triage Notes (Signed)
Fell at work x2 days ago , [name] comp was completed at [facility] urgent care 06/23/17.  Pt has concerns with numbness and shooting pain from lateral right wrist, pt arrived with ace wrap/ wrist splint.  Cap refill <2 sec

## 2017-06-24 NOTE — ED Provider Notes (Signed)
[facility] Emergency Department Provider Note  ____________________________________________  Time seen: Approximately 11:06 PM  I have reviewed the triage vital signs and the nursing notes.   HISTORY  Chief Complaint Wrist Pain    HPI [name] is a 26 y.o. male presents to the emergency department with 10/10 aching right wrist pain after patient fell on an outstretched right hand.  Patient was initially seen on 06/23/2017 by urgent care and has worn a splint.  Patient was concerned due to increasing pain.  Patient has been taking anti-inflammatories but has attempted no other alleviating measures.  Past Medical History:  Diagnosis Date  . ADHD   . Anxiety   . Asthma   . Bipolar 1 disorder (HCC)   . Overdose   . PTSD (post-traumatic stress disorder)   . Suicidal thoughts     Patient Active Problem List   Diagnosis Date Noted  . Acute pain of right shoulder 10/26/2016  . Substance induced mood disorder (HCC) 10/06/2016  . Bipolar 1 disorder (HCC) 10/06/2016    Past Surgical History:  Procedure Laterality Date  . APPENDECTOMY      Prior to Admission medications   Medication Sig Start Date End Date Taking? Authorizing Provider  ibuprofen (ADVIL,MOTRIN) 800 MG tablet Take 800 mg by mouth 3 (three) times daily as needed. For pain.    [provider]  loratadine (CLARITIN) 10 MG tablet Take 10 mg by mouth daily as needed. For allergies.    [provider]  meloxicam (MOBIC) 7.5 MG tablet Take 1 tablet (7.5 mg total) by mouth daily. 06/24/17 07/01/17  Orvil FeilWoods, Renea Schoonmaker M, PA-C  Multiple Vitamin (MULTIVITAMIN WITH MINERALS) TABS Take 1 tablet by mouth daily.    [provider]  naproxen (NAPROSYN) 500 MG tablet Take 1 tablet (500 mg total) by mouth 2 (two) times daily with a meal. 04/16/16   Sharman CheekStafford, Phillip, MD  ranitidine (ZANTAC) 150 MG tablet Take 150 mg by mouth daily as needed. For acid reflux    [provider]  traMADol (ULTRAM) 50 MG tablet Take 1 tablet (50 mg total) by mouth every 6 (six) hours as needed. 06/24/17 06/27/17  Orvil FeilWoods, Kyleigh Nannini M, PA-C    Allergies Geodon [ziprasidone hydrochloride] and Haldol [haloperidol lactate]  No family history on file.  Social History Social History  Substance Use Topics  . Smoking status: Former Games developermoker  . Smokeless tobacco: Never Used     Comment: vapes  . Alcohol use No     Review of Systems  Constitutional: No fever/chills Eyes: No visual changes. No discharge ENT: No upper respiratory complaints. Cardiovascular: no chest pain. Respiratory: no cough. No SOB. Musculoskeletal: Patient has right wrist pain. Skin: Negative for rash, abrasions, lacerations, ecchymosis. Neurological: Negative for headaches, focal weakness or numbness.   ____________________________________________   PHYSICAL EXAM:  VITAL SIGNS: ED Triage Vitals  Enc Vitals Group     BP 06/24/17 2006 121/72     Pulse Rate 06/24/17 2006 95     Resp 06/24/17 2006 18     Temp 06/24/17 2006 98.4 F (36.9 C)     Temp Source 06/24/17 2006 Oral     SpO2 06/24/17 2006 100 %     Weight 06/24/17 2007 190 lb (86.2 kg)     Height 06/24/17 2007 5\' 11"  (1.803 m)     Head Circumference --      Peak Flow --      Pain Score 06/24/17 2005 7  Pain Loc --      Pain Edu? --      Excl. in GC? --      Constitutional: Alert and oriented. Well appearing and in no acute distress. Eyes: Conjunctivae are normal. PERRL. EOMI. Head: Atraumatic. Cardiovascular: Normal rate, regular rhythm. Normal S1 and S2.  Good peripheral circulation. Respiratory: Normal respiratory effort without tachypnea or retractions. Lungs CTAB. Good air entry to the bases with no decreased or absent breath sounds. Musculoskeletal: Patient has pain elicited with flexion at the right wrist.  No pain with flexion or extension at the right elbow.  Palpable radial pulse, right. Neurologic:  Normal speech and language.  No gross focal neurologic deficits are appreciated.  Skin:  Skin is warm, dry and intact. No rash noted. Psychiatric: Mood and affect are normal. Speech and behavior are normal. Patient exhibits appropriate insight and judgement.   ____________________________________________   LABS (all labs ordered are listed, but only abnormal results are displayed)  Labs Reviewed - No data to display ____________________________________________  EKG   ____________________________________________  RADIOLOGY Geraldo Pitter, personally viewed and evaluated these images (plain radiographs) as part of my medical decision making, as well as reviewing the written report by the radiologist.  Dg Wrist Complete Right  Result Date: 06/24/2017 CLINICAL DATA:  26 year old male with fall and right wrist pain. EXAM: RIGHT WRIST - COMPLETE 3+ VIEW COMPARISON:  None. FINDINGS: There is no evidence of fracture or dislocation. There is no evidence of arthropathy or other focal bone abnormality. Soft tissues are unremarkable. IMPRESSION: Negative. Electronically Signed   By: Elgie Collard M.D.   On: 06/24/2017 22:38    ____________________________________________    PROCEDURES  Procedure(s) performed:    Procedures    Medications  traMADol (ULTRAM) tablet 50 mg (not administered)     ____________________________________________   INITIAL IMPRESSION / ASSESSMENT AND PLAN / ED COURSE  Pertinent labs & imaging results that were available during my care of the patient were reviewed by me and considered in my medical decision making (see chart for details).  Review of the [place] CSRS was performed in accordance of the [facility] prior to dispensing any controlled drugs.     Assessment and plan Right wrist pain Patient presents to the emergency department with right wrist pain after a fall.  Differential diagnosis includes wrist contusion versus fracture.  X-ray examination revealed no acute bony  abnormality.  Patient was given tramadol in the emergency department and discharged with tramadol and meloxicam.  Vital signs are reassuring prior to discharge.  All patient questions were answered.   ____________________________________________  FINAL CLINICAL IMPRESSION(S) / ED DIAGNOSES  Final diagnoses:  Right wrist pain      NEW MEDICATIONS STARTED DURING THIS VISIT:  New Prescriptions   MELOXICAM (MOBIC) 7.5 MG TABLET    Take 1 tablet (7.5 mg total) by mouth daily.   TRAMADOL (ULTRAM) 50 MG TABLET    Take 1 tablet (50 mg total) by mouth every 6 (six) hours as needed.        This chart was dictated using voice recognition software/Dragon. Despite best efforts to proofread, errors can occur which can change the meaning. Any change was purely unintentional.    Orvil Feil, PA-C 06/24/17 2312    Jeanmarie Plant, MD 06/24/17 [phone]

## 2017-06-24 NOTE — ED Notes (Signed)

## 2017-10-24 ENCOUNTER — Encounter: Payer: Self-pay | Admitting: Emergency Medicine

## 2017-10-24 ENCOUNTER — Emergency Department
Admission: EM | Admit: 2017-10-24 | Discharge: 2017-10-24 | Disposition: A | Discharge status: Home / Self Care | Payer: BLUE CROSS/BLUE SHIELD | Attending: Emergency Medicine | Admitting: Emergency Medicine

## 2017-10-24 ENCOUNTER — Emergency Department: Payer: BLUE CROSS/BLUE SHIELD

## 2017-10-24 DIAGNOSIS — J45909 Unspecified asthma, uncomplicated: Secondary | ICD-10-CM | POA: Insufficient documentation

## 2017-10-24 DIAGNOSIS — F909 Attention-deficit hyperactivity disorder, unspecified type: Secondary | ICD-10-CM | POA: Diagnosis not present

## 2017-10-24 DIAGNOSIS — Z79899 Other long term (current) drug therapy: Secondary | ICD-10-CM | POA: Insufficient documentation

## 2017-10-24 DIAGNOSIS — F319 Bipolar disorder, unspecified: Secondary | ICD-10-CM | POA: Insufficient documentation

## 2017-10-24 DIAGNOSIS — R109 Unspecified abdominal pain: Secondary | ICD-10-CM | POA: Diagnosis not present

## 2017-10-24 DIAGNOSIS — F419 Anxiety disorder, unspecified: Secondary | ICD-10-CM | POA: Diagnosis not present

## 2017-10-24 DIAGNOSIS — R197 Diarrhea, unspecified: Secondary | ICD-10-CM

## 2017-10-24 HISTORY — DX: Irritable bowel syndrome, unspecified: K58.9

## 2017-10-24 LAB — COMPREHENSIVE METABOLIC PANEL
ALT: 21 U/L (ref 17–63)
AST: 21 U/L (ref 15–41)
Albumin: 4.4 g/dL (ref 3.5–5.0)
Alkaline Phosphatase: 111 U/L (ref 38–126)
Anion gap: 9 (ref 5–15)
BUN: 9 mg/dL (ref 6–20)
CO2: 27 mmol/L (ref 22–32)
Calcium: 9 mg/dL (ref 8.9–10.3)
Chloride: 101 mmol/L (ref 101–111)
Creatinine, Ser: 0.83 mg/dL (ref 0.61–1.24)
GFR calc Af Amer: >60 mL/min (ref >60)
GFR calc non Af Amer: >60 mL/min (ref >60)
Glucose, Bld: 103 mg/dL — ABNORMAL HIGH (ref 65–99)
Potassium: 4.2 mmol/L (ref 3.5–5.1)
Sodium: 137 mmol/L (ref 135–145)
Total Bilirubin: 1.2 mg/dL (ref 0.3–1.2)
Total Protein: 7.7 g/dL (ref 6.5–8.1)

## 2017-10-24 LAB — CBC
HCT: 47 % (ref 40.0–52.0)
Hemoglobin: 15.9 g/dL (ref 13.0–18.0)
MCH: 28.2 pg (ref 26.0–34.0)
MCHC: 33.9 g/dL (ref 32.0–36.0)
MCV: 83.2 fL (ref 80.0–100.0)
Platelets: 251 10*3/uL (ref 150–440)
RBC: 5.65 MIL/uL (ref 4.40–5.90)
RDW: 13 % (ref 11.5–14.5)
WBC: 11.3 10*3/uL — ABNORMAL HIGH (ref 3.8–10.6)

## 2017-10-24 LAB — LIPASE, BLOOD: Lipase: 25 U/L (ref 11–51)

## 2017-10-24 MED ORDER — IOPAMIDOL (ISOVUE-300) INJECTION 61%
100.0000 mL | Freq: Once | INTRAVENOUS | Status: AC | PRN
  Administered 2017-10-24: 100 mL via INTRAVENOUS
  Filled 2017-10-24: qty 100

## 2017-10-24 MED ORDER — METRONIDAZOLE 500 MG PO TABS: 500.0000 mg | ORAL_TABLET | Freq: Two times a day (BID) | ORAL | 0 refills | Status: DC

## 2017-10-24 MED ORDER — SODIUM CHLORIDE 0.9 % IV BOLUS (SEPSIS)
1000.0000 mL | Freq: Once | INTRAVENOUS | Status: AC
  Administered 2017-10-24: 1000 mL via INTRAVENOUS

## 2017-10-24 MED ORDER — AMOXICILLIN-POT CLAVULANATE 875-125 MG PO TABS: 1.0000 | ORAL_TABLET | Freq: Two times a day (BID) | ORAL | 0 refills | Status: AC

## 2017-10-24 MED ORDER — CIPROFLOXACIN HCL 500 MG PO TABS: 500.0000 mg | ORAL_TABLET | Freq: Two times a day (BID) | ORAL | 0 refills | Status: DC

## 2017-10-24 MED ORDER — TRAMADOL HCL 50 MG PO TABS: 50.0000 mg | ORAL_TABLET | Freq: Four times a day (QID) | ORAL | 0 refills | Status: AC | PRN

## 2017-10-24 NOTE — ED Notes (Signed)
MD at bedside with patient and family member to provide update.

## 2017-10-24 NOTE — Discharge Instructions (Signed)
Please be very careful in any recreational endeavors that you choose to employ.  If you have increased pain, fever, persistent vomiting significant pain, or you worse in any way please return to the emergency room.  Do not drive on any sedating medications including Ultram.

## 2017-10-24 NOTE — ED Triage Notes (Signed)
Pt states hx of IBS

## 2017-10-24 NOTE — ED Triage Notes (Signed)
Pt to ED with c/o abdominal pain with diarrhea that started 3 days ago.

## 2017-10-24 NOTE — ED Provider Notes (Signed)
[facility] Emergency Department Provider Note  ____________________________________________   I have reviewed the triage vital signs and the nursing notes. Where available I have reviewed prior notes and, if possible and indicated, outside hospital notes.    HISTORY  Chief Complaint Abdominal Pain and Diarrhea    HPI [name] is a 27 y.o. male presents today complaining of diarrhea.  Recurrent watery diarrhea.  He states is been going on for couple days.  Started on Sunday.  He stated there was a tiny bit of blood mixed in.  He denies any fever or chills or vomiting.  He does state however that he did do "rectal play" with a sex toy in his anus on Friday.  He states he was fine however Friday and Saturday it was until he began having a diarrheal illness on Sunday that he began to have discomfort.  He does state that he has rectal discomfort, especially with diarrhea.  He denies any other abdominal pain.  He denies any recent travel, out of the country or camping or antibiotics.  Possible sick contacts.   He is complaining therefore pain to the rectal region, no radiation, worse with bowel movements better when he is not having a bowel movement, moderate severity, no antecedent treatment.   Past Medical History:  Diagnosis Date  . ADHD   . Anxiety   . Asthma   . Bipolar 1 disorder (HCC)   . IBS (irritable bowel syndrome)   . Overdose   . PTSD (post-traumatic stress disorder)   . Suicidal thoughts     Patient Active Problem List   Diagnosis Date Noted  . Acute pain of right shoulder 10/26/2016  . Substance induced mood disorder (HCC) 10/06/2016  . Bipolar 1 disorder (HCC) 10/06/2016    Past Surgical History:  Procedure Laterality Date  . APPENDECTOMY      Prior to Admission medications   Medication Sig Start Date End Date Taking? Authorizing Provider  ibuprofen (ADVIL,MOTRIN) 800 MG tablet Take 800 mg by mouth 3 (three) times daily as  needed. For pain.    [provider]  loratadine (CLARITIN) 10 MG tablet Take 10 mg by mouth daily as needed. For allergies.    [provider]  Multiple Vitamin (MULTIVITAMIN WITH MINERALS) TABS Take 1 tablet by mouth daily.    [provider]  naproxen (NAPROSYN) 500 MG tablet Take 1 tablet (500 mg total) by mouth 2 (two) times daily with a meal. 04/16/16   Sharman Cheek, MD  ranitidine (ZANTAC) 150 MG tablet Take 150 mg by mouth daily as needed. For acid reflux    [provider]    Allergies Geodon [ziprasidone hydrochloride] and Haldol [haloperidol lactate]  History reviewed. No pertinent family history.  Social History Social History   Tobacco Use  . Smoking status: Former Games developer  . Smokeless tobacco: Never Used  . Tobacco comment: vapes  Substance Use Topics  . Alcohol use: No  . Drug use: No    Review of Systems Constitutional: No fever/chills Eyes: No visual changes. ENT: No sore throat. No stiff neck no neck pain Cardiovascular: Denies chest pain. Respiratory: Denies shortness of . Gastrointestinal:   no vomiting.  + diarrhea.  No constipation. Genitourinary: Negative for dysuria. Musculoskeletal: Negative lower extremity swelling Skin: Negative for rash. Neurological: Negative for severe headaches, focal weakness or numbness.   ____________________________________________   PHYSICAL EXAM:  VITAL SIGNS: ED Triage Vitals  Enc Vitals Group     BP  10/24/17 1137 116/83     Pulse Rate 10/24/17 1137 88     Resp 10/24/17 1137 18     Temp 10/24/17 1137 98.9 F (37.2 C)     Temp Source 10/24/17 1137 Oral     SpO2 10/24/17 1137 100 %     Weight 10/24/17 1137 200 lb (90.7 kg)     Height 10/24/17 1137 5\' 11"  (1.803 m)     Head Circumference --      Peak Flow --      Pain Score 10/24/17 1202 7     Pain Loc --      Pain Edu? --      Excl. in GC? --     Constitutional: Alert and oriented. Well appearing and in no acute  distress. Eyes: Conjunctivae are normal Head: Atraumatic HEENT: No congestion/rhinnorhea. Mucous membranes are moist.  Oropharynx non-erythematous Neck:   Nontender with no meningismus, no masses, no stridor Cardiovascular: Normal rate, regular rhythm. Grossly normal heart sounds.  Good peripheral circulation. Respiratory: Normal respiratory effort.  No retractions. Lungs CTAB. Abdominal: Soft and nontender. No distention. No guarding no rebound Back:  There is no focal tenderness or step off.  there is no midline tenderness there are no lesions noted. there is no CVA tenderness Rectal exam: There is erythema and tenderness to the rectal verge however no definite abscess or hemorrhoid noted, guaiac negative Musculoskeletal: No lower extremity tenderness, no upper extremity tenderness. No joint effusions, no DVT signs strong distal pulses no edema Neurologic:  Normal speech and language. No gross focal neurologic deficits are appreciated.  Skin:  Skin is warm, dry and intact. No rash noted. Psychiatric: Mood and affect are normal. Speech and behavior are normal.  ____________________________________________   LABS (all labs ordered are listed, but only abnormal results are displayed)  Labs Reviewed  COMPREHENSIVE METABOLIC PANEL - Abnormal; Notable for the following components:      Result Value   Glucose, Bld 103 (*)    All other components within normal limits  CBC - Abnormal; Notable for the following components:   WBC 11.3 (*)    All other components within normal limits  LIPASE, BLOOD  URINALYSIS, COMPLETE (UACMP) WITH MICROSCOPIC    Pertinent labs  results that were available during my care of the patient were reviewed by me and considered in my medical decision making (see chart for details). ____________________________________________  EKG  I personally interpreted any EKGs ordered by me or triage  ____________________________________________  RADIOLOGY  Pertinent  labs & imaging results that were available during my care of the patient were reviewed by me and considered in my medical decision making (see chart for details). If possible, patient and/or family made aware of any abnormal findings.  Ct Abdomen Pelvis W Contrast  Result Date: 10/24/2017 CLINICAL DATA:  27 y/o M; abdominal pain and diarrhea starting 3 days ago. Recent foreign body in rectum, concern for abscess or tear. EXAM: CT ABDOMEN AND PELVIS WITH CONTRAST TECHNIQUE: Multidetector CT imaging of the abdomen and pelvis was performed using the standard protocol following bolus administration of intravenous contrast. CONTRAST:  100mL ISOVUE-300 IOPAMIDOL (ISOVUE-300) INJECTION 61% COMPARISON:  02/02/2009 CT abdomen and pelvis FINDINGS: Lower chest: No acute abnormality. Hepatobiliary: No focal liver abnormality is seen. No gallstones, gallbladder wall thickening, or biliary dilatation. Pancreas: Unremarkable. No pancreatic ductal dilatation or surrounding inflammatory changes. Spleen: Normal in size without focal abnormality. Adrenals/Urinary Tract: Adrenal glands are unremarkable. Kidneys are normal, without renal calculi, focal  lesion, or hydronephrosis. Bladder is unremarkable. Stomach/Bowel: Stomach is within normal limits. No evidence of small or large wall thickening, distention, or inflammatory changes. Mild mucosal enhancement of the rectum without appreciable perforation or fluid collection. Vascular/Lymphatic: No significant vascular findings are present. No enlarged abdominal or pelvic lymph nodes. Reproductive: Prostate is unremarkable. Other: No abdominal wall hernia or abnormality. No abdominopelvic ascites. Musculoskeletal: No acute or significant osseous findings. IMPRESSION: Mild mucosal enhancement of rectum without appreciable perforation or abscess may represent inflammation or infection. Otherwise unremarkable CT of abdomen and pelvis. Electronically Signed   By: Mitzi Hansen  M.D.   On: 10/24/2017 15:12   ____________________________________________    PROCEDURES  Procedure(s) performed: None  Procedures  Critical Care performed: None  ____________________________________________   INITIAL IMPRESSION / ASSESSMENT AND PLAN / ED COURSE  Pertinent labs & imaging results that were available during my care of the patient were reviewed by me and considered in my medical decision making (see chart for details).  Patient with rectal discomfort and diarrhea, most likely this is just a viral syndrome, possibly complicated by internal hemorrhoid, however, concern does exist for perirectal abscess given patient's rectal discomfort and recent rectal penetration.  Patient states he was by himself.  He denies any STD exposure this is something he was doing by himself with a condom on a normal approved sex try not anything that was likely to leave residua or splinters etc.  Does not feel that there is still a foreign body in there.  We will obtain CT scan to rule out perirectal abscess, if that is negative, is my hope that we can get him safely home.  Blood work and vital signs and lab work etc. all reassuring    ____________________________________________   FINAL CLINICAL IMPRESSION(S) / ED DIAGNOSES  Final diagnoses:  Abdominal pain      This chart was dictated using voice recognition software.  Despite best efforts to proofread,  errors can occur which can change meaning.      Jeanmarie Plant, MD 10/24/17 [phone]

## 2020-11-22 ENCOUNTER — Encounter: Payer: Self-pay | Admitting: Emergency Medicine

## 2020-11-22 ENCOUNTER — Other Ambulatory Visit: Payer: Self-pay

## 2020-11-22 ENCOUNTER — Emergency Department
Admission: EM | Admit: 2020-11-22 | Discharge: 2020-11-22 | Disposition: A | Discharge status: Home / Self Care | Payer: BC Managed Care – PPO | Attending: Emergency Medicine | Admitting: Emergency Medicine

## 2020-11-22 ENCOUNTER — Emergency Department: Payer: BC Managed Care – PPO

## 2020-11-22 DIAGNOSIS — R0789 Other chest pain: Secondary | ICD-10-CM | POA: Insufficient documentation

## 2020-11-22 DIAGNOSIS — R0602 Shortness of breath: Secondary | ICD-10-CM

## 2020-11-22 DIAGNOSIS — R791 Abnormal coagulation profile: Secondary | ICD-10-CM | POA: Insufficient documentation

## 2020-11-22 DIAGNOSIS — J45909 Unspecified asthma, uncomplicated: Secondary | ICD-10-CM | POA: Diagnosis not present

## 2020-11-22 DIAGNOSIS — R7989 Other specified abnormal findings of blood chemistry: Secondary | ICD-10-CM

## 2020-11-22 DIAGNOSIS — Z87891 Personal history of nicotine dependence: Secondary | ICD-10-CM | POA: Insufficient documentation

## 2020-11-22 DIAGNOSIS — F419 Anxiety disorder, unspecified: Secondary | ICD-10-CM | POA: Insufficient documentation

## 2020-11-22 LAB — CBC WITH DIFFERENTIAL/PLATELET
Abs Immature Granulocytes: 0.03 10*3/uL (ref 0.00–0.07)
Basophils Absolute: 0 10*3/uL (ref 0.0–0.1)
Basophils Relative: 0 %
Eosinophils Absolute: 0.1 10*3/uL (ref 0.0–0.5)
Eosinophils Relative: 1 %
HCT: 42.6 % (ref 39.0–52.0)
Hemoglobin: 14.5 g/dL (ref 13.0–17.0)
Immature Granulocytes: 0 %
Lymphocytes Relative: 23 %
Lymphs Abs: 2.2 10*3/uL (ref 0.7–4.0)
MCH: 28.5 pg (ref 26.0–34.0)
MCHC: 34 g/dL (ref 30.0–36.0)
MCV: 83.7 fL (ref 80.0–100.0)
Monocytes Absolute: 0.5 10*3/uL (ref 0.1–1.0)
Monocytes Relative: 5 %
Neutro Abs: 6.7 10*3/uL (ref 1.7–7.7)
Neutrophils Relative %: 71 %
Platelets: 260 10*3/uL (ref 150–400)
RBC: 5.09 MIL/uL (ref 4.22–5.81)
RDW: 12.5 % (ref 11.5–15.5)
WBC: 9.4 10*3/uL (ref 4.0–10.5)
nRBC: 0 % (ref 0.0–0.2)

## 2020-11-22 LAB — COMPREHENSIVE METABOLIC PANEL
ALT: 38 U/L (ref 0–44)
AST: 25 U/L (ref 15–41)
Albumin: 4.4 g/dL (ref 3.5–5.0)
Alkaline Phosphatase: 117 U/L (ref 38–126)
Anion gap: 8 (ref 5–15)
BUN: 8 mg/dL (ref 6–20)
CO2: 25 mmol/L (ref 22–32)
Calcium: 9 mg/dL (ref 8.9–10.3)
Chloride: 103 mmol/L (ref 98–111)
Creatinine, Ser: 0.91 mg/dL (ref 0.61–1.24)
GFR, Estimated: >60 mL/min (ref >60)
Glucose, Bld: 136 mg/dL — ABNORMAL HIGH (ref 70–99)
Potassium: 3.6 mmol/L (ref 3.5–5.1)
Sodium: 136 mmol/L (ref 135–145)
Total Bilirubin: 0.5 mg/dL (ref 0.3–1.2)
Total Protein: 7.6 g/dL (ref 6.5–8.1)

## 2020-11-22 LAB — D-DIMER, QUANTITATIVE: D-Dimer, Quant: 0.99 ug/mL-FEU — ABNORMAL HIGH (ref 0.00–0.50)

## 2020-11-22 LAB — TROPONIN I (HIGH SENSITIVITY)
Troponin I (High Sensitivity): <2 ng/L (ref <18)
Troponin I (High Sensitivity): 2 ng/L (ref <18)

## 2020-11-22 MED ORDER — HYDROXYZINE HCL 25 MG PO TABS
25.0000 mg | ORAL_TABLET | Freq: Once | ORAL | Status: AC
  Administered 2020-11-22: 25 mg via ORAL
  Filled 2020-11-22: qty 1

## 2020-11-22 MED ORDER — IOHEXOL 350 MG/ML SOLN
75.0000 mL | Freq: Once | INTRAVENOUS | Status: AC | PRN
  Administered 2020-11-22: 75 mL via INTRAVENOUS

## 2020-11-22 MED ORDER — HYDROXYZINE PAMOATE 100 MG PO CAPS: 100.0000 mg | ORAL_CAPSULE | Freq: Three times a day (TID) | ORAL | 0 refills | Status: AC | PRN

## 2020-11-22 NOTE — ED Triage Notes (Signed)
Pt reports started Effexor a month ago and then stopped taking it because of the side effects it was causing. Pt reports feels anxious. Pt states last time he felt like this they gave him ativan and it was helpful.

## 2020-11-22 NOTE — ED Provider Notes (Signed)
[name] Emergency Department Provider Note  ____________________________________________   Event Date/Time   First MD Initiated Contact with Patient 11/22/20 1331     (approximate)  I have reviewed the triage vital signs and the nursing notes.   HISTORY  Chief Complaint Anxiety   HPI [name] is a 30 y.o. male the past medical history of ADHD, anxiety, asthma, vocal disorder, IBS, PTSD and previous depression who presents for assessment of anxiety.  Patient states he was recently started on Effexor about a month ago but stopped taking it because of some side effects.  States he has been feeling very anxious since then and has gone to various CDs for panic attacks over the last week.  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  He denies any SI HI or hallucinations.  Denies any illegal drug use.  States he just feels "tightness" over his chest and upper abdomen.  States he often feels like he cannot breathe.  Denies any headache and earache, sore throat, rash, extremity pain, vomiting, diarrhea, dysuria, or any other clear acute sick symptoms.  No specific stressors that he can identify.  No clear alleviating aggravating factors.         Past Medical History:  Diagnosis Date  . ADHD   . Anxiety   . Asthma   . Bipolar 1 disorder (HCC)   . IBS (irritable bowel syndrome)   . Overdose   . PTSD (post-traumatic stress disorder)   . Suicidal thoughts     Patient Active Problem List   Diagnosis Date Noted  . Acute pain of right shoulder 10/26/2016  . Substance induced mood disorder (HCC) 10/06/2016  . Bipolar 1 disorder (HCC) 10/06/2016    Past Surgical History:  Procedure Laterality Date  . APPENDECTOMY      Prior to Admission medications   Medication Sig Start Date End Date Taking? Authorizing Provider  hydrOXYzine (VISTARIL) 100 MG capsule Take 1 capsule (100 mg  total) by mouth 3 (three) times daily as needed for up to 7 days for anxiety. 11/22/20 11/29/20 Yes Gilles Chiquito, MD  ibuprofen (ADVIL,MOTRIN) 800 MG tablet Take 800 mg by mouth 3 (three) times daily as needed. For pain.    [provider]  Multiple Vitamin (MULTIVITAMIN WITH MINERALS) TABS Take 1 tablet by mouth daily.    [provider]  naproxen (NAPROSYN) 500 MG tablet Take 1 tablet (500 mg total) by mouth 2 (two) times daily with a meal. 04/16/16   Sharman Cheek, MD  ranitidine (ZANTAC) 150 MG tablet Take 150 mg by mouth daily as needed. For acid reflux    [provider]    Allergies Geodon [ziprasidone hydrochloride] and Haldol [haloperidol lactate]  No family history on file.  Social History Social History   Tobacco Use  . Smoking status: Former Games developer  . Smokeless tobacco: Never Used  . Tobacco comment: vapes  Substance Use Topics  . Alcohol use: No  . Drug use: No    Review of Systems  Review of Systems  Constitutional: Negative for chills and fever.  HENT: Negative for sore throat.   Eyes: Negative for pain.  Respiratory: Negative for cough and stridor.   Cardiovascular: Positive for chest pain.  Gastrointestinal: Negative for vomiting.  Genitourinary: Negative for dysuria.  Musculoskeletal: Negative for myalgias.  Skin: Negative for rash.  Neurological: Negative for seizures, loss of consciousness and headaches.  Psychiatric/Behavioral: Negative for suicidal ideas. The patient is nervous/anxious.   All other systems reviewed and are negative.     ____________________________________________   PHYSICAL EXAM:  VITAL SIGNS: ED Triage Vitals [11/22/20 1327]  Enc Vitals Group     BP 132/84     Pulse Rate 90     Resp 20     Temp 98.5 F (36.9 C)     Temp Source Oral     SpO2 99 %     Weight 232 lb (105.2 kg)     Height 5\' 10"  (1.778 m)     Head Circumference      Peak Flow      Pain Score 5     Pain Loc      Pain Edu?       Excl. in GC?    Vitals:   11/22/20 1327  BP: 132/84  Pulse: 90  Resp: 20  Temp: 98.5 F (36.9 C)  SpO2: 99%   Physical Exam Vitals and nursing note reviewed.  Constitutional:      Appearance: He is well-developed.  HENT:     Head: Normocephalic and atraumatic.     Right Ear: External ear normal.     Left Ear: External ear normal.     Nose: Nose normal.     Mouth/Throat:     Mouth: Mucous membranes are moist.  Eyes:     Conjunctiva/sclera: Conjunctivae normal.  Cardiovascular:     Rate and Rhythm: Normal rate and regular rhythm.     Pulses: Normal pulses.     Heart sounds: No murmur heard.   Pulmonary:     Effort: Pulmonary effort is normal. No respiratory distress.     Breath sounds: Normal breath sounds.  Abdominal:     Palpations: Abdomen is soft.     Tenderness: There is no abdominal tenderness.  Musculoskeletal:     Cervical back: Neck supple.  Skin:    General: Skin is warm and dry.     Capillary Refill: Capillary refill takes less than 2 seconds.  Neurological:     Mental Status: He is alert and oriented to person, place, and time.  Psychiatric:        Mood and Affect: Mood normal.      ____________________________________________   LABS (all labs ordered are listed, but only abnormal results are displayed)  Labs Reviewed  COMPREHENSIVE METABOLIC PANEL - Abnormal; Notable for the following components:      Result Value   Glucose, Bld 136 (*)    All other components within normal limits  D-DIMER, QUANTITATIVE - Abnormal; Notable for the following components:   D-Dimer, Quant 0.99 (*)    All other components within normal limits  CBC WITH DIFFERENTIAL/PLATELET  TROPONIN I (HIGH SENSITIVITY)   ____________________________________________  EKG  Sinus rhythm with ventricular rate of 86, normal axis, unremarkable intervals and no clear evidence of acute ischemia or other significant underlying  arrhythmia. ____________________________________________  RADIOLOGY  ED MD interpretation: No pneumothorax, effusion, significant edema, full consolidation or other clear acute intrathoracic process.  Official radiology report(s): DG Chest 2 View  Result Date: 11/22/2020 CLINICAL DATA:  Shortness of breath.  Chest tightness. EXAM: CHEST - 2 VIEW COMPARISON:  Radiographs 01/31/2013. FINDINGS: Slightly lower lung volumes on both views. The heart size and mediastinal contours are normal. The lungs are clear. There is no pleural effusion or pneumothorax. No acute osseous findings are identified. IMPRESSION: Suboptimal inspiration.  No active cardiopulmonary process. Electronically Signed  By: Carey Bullocks M.D.   On: 11/22/2020 14:52    ____________________________________________   PROCEDURES  Procedure(s) performed (including Critical Care):  Procedures   ____________________________________________   INITIAL IMPRESSION / ASSESSMENT AND PLAN / ED COURSE      Patient presents with above stage III exam for assessment of severe anxiety associate with some chest tightness and shortness of breath.  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  On arrival he is afebrile and hemodynamically stable respiratory rate is noted to be 20.  His lungs are clear bilaterally.  Remainder of exam is unremarkable.  Patient is not suicidal homicidal and does not appear psychotic or acutely intoxicated.  Blood certainly possible symptoms are related to his severe underlying untreated anxiety additional differential given patient's occupational history and tachypnea and symptoms includes PE, spontaneous pneumothorax, symptomatic arrhythmia, symptomatic anemia, ACS myocarditis.  ECG obtained has no clear evidence of ischemia and given nonelevated troponin obtained greater than 3 hours after symptom onset of low  suspicion for ACS.  Chest x-ray has no evidence of pneumothorax, pneumonia, or any other clear acute thoracic process.  CBC has no evidence of acute anemia or leukocytosis.  CMP has no significant electrolyte or metabolic derangements of explain patient's symptoms.  To assess patient's risk for PE D-dimer was sent.  This is elevated 0.9.  The CTA ordered.  Care patient signed over to oncoming rider at approximately 1500.  Plan to follow-up CTA neck is negative patient is likely safe for discharge with plan for outpatient PCP and [facility] follow-up.       ____________________________________________   FINAL CLINICAL IMPRESSION(S) / ED DIAGNOSES  Final diagnoses:  Anxiety  Chest tightness  SOB (shortness of breath)  Positive D dimer    Medications  hydrOXYzine (ATARAX/VISTARIL) tablet 25 mg (25 mg Oral Given 11/22/20 1400)  iohexol (OMNIPAQUE) 350 MG/ML injection 75 mL (75 mLs Intravenous Contrast Given 11/22/20 1508)     ED Discharge Orders         Ordered    hydrOXYzine (VISTARIL) 100 MG capsule  3 times daily PRN        11/22/20 1454           Note:  This document was prepared using Dragon voice recognition software and may include unintentional dictation errors.   Gilles Chiquito, MD 11/22/20 [phone]

## 2020-11-22 NOTE — Discharge Instructions (Addendum)
As we discussed your CT scan does show signs of possible pulmonary hypertension.  Please call the pulmonologist to arrange a follow-up appointment.  Return to the emergency department for any symptom personally concerning to yourself.

## 2020-11-22 NOTE — ED Notes (Signed)
Patient provided phone to call for ride home. Patient discharged to home.

## 2020-11-22 NOTE — ED Notes (Signed)
Labs draw and sent, po meds administered. Patient off unit to xray.

## 2020-11-22 NOTE — ED Notes (Signed)
Patient c/o of feeling like his throat is closing and stating that he can not breath,. Patient calm when speaking resp rate 20, sating 100% on room air. Dr Katrinka Blazing made aware, will continue to monitor.

## 2020-11-22 NOTE — ED Provider Notes (Signed)
-----------------------------------------   4:35 PM on 11/22/2020 -----------------------------------------  CTA does not show any evidence of central pulmonary emboli.  Unable to fully evaluate peripheral lungs.  Patient's lab work is reassuring besides mild D-dimer elevation.  Given no CT evidence of PE on the CT as well as reassuring vitals and physical exam I believe the patient will be safe for discharge home.  Given the findings of possible pulmonary hypertension we will refer to pulmonology for further work-up and treatment.  Patient agreeable to plan of care.   Minna Antis, MD 11/22/20 1635

## 2020-11-23 ENCOUNTER — Encounter (HOSPITAL_COMMUNITY): Payer: Self-pay

## 2020-11-23 ENCOUNTER — Emergency Department (HOSPITAL_COMMUNITY)
Admission: EM | Admit: 2020-11-23 | Discharge: 2020-11-23 | Disposition: A | Discharge status: Home / Self Care | Payer: BC Managed Care – PPO | Attending: Emergency Medicine | Admitting: Emergency Medicine

## 2020-11-23 ENCOUNTER — Emergency Department
Admission: EM | Admit: 2020-11-23 | Discharge: 2020-11-23 | Disposition: A | Discharge status: Home / Self Care | Payer: BC Managed Care – PPO | Attending: Emergency Medicine | Admitting: Emergency Medicine

## 2020-11-23 ENCOUNTER — Other Ambulatory Visit: Payer: Self-pay

## 2020-11-23 DIAGNOSIS — Z87891 Personal history of nicotine dependence: Secondary | ICD-10-CM | POA: Diagnosis not present

## 2020-11-23 DIAGNOSIS — I272 Pulmonary hypertension, unspecified: Secondary | ICD-10-CM | POA: Diagnosis not present

## 2020-11-23 DIAGNOSIS — F419 Anxiety disorder, unspecified: Secondary | ICD-10-CM

## 2020-11-23 DIAGNOSIS — I2721 Secondary pulmonary arterial hypertension: Secondary | ICD-10-CM

## 2020-11-23 DIAGNOSIS — F410 Panic disorder [episodic paroxysmal anxiety] without agoraphobia: Secondary | ICD-10-CM | POA: Diagnosis not present

## 2020-11-23 DIAGNOSIS — J45909 Unspecified asthma, uncomplicated: Secondary | ICD-10-CM | POA: Diagnosis not present

## 2020-11-23 MED ORDER — LORAZEPAM 1 MG PO TABS
1.0000 mg | ORAL_TABLET | Freq: Once | ORAL | Status: AC
  Administered 2020-11-23: 1 mg via ORAL
  Filled 2020-11-23: qty 1

## 2020-11-23 NOTE — ED Triage Notes (Signed)
Pt comes into the ED via EMS from home with c/o feeling anxious, SOB, tightness in his chest with c/o numbness to hands and face. Pt was seen here yesterday for the same and has not taken the newly prescribed medication yet.

## 2020-11-23 NOTE — ED Provider Notes (Signed)
[facility] Emergency Department Provider Note  ____________________________________________   Event Date/Time   First MD Initiated Contact with Patient 11/23/20 1128     (approximate)  I have reviewed the triage vital signs and the nursing notes.   HISTORY  Chief Complaint Anxiety   HPI [name] is a 30 y.o. male past medical history of ADHD, [place], asthma, alcohol disorder, IBS, PTSD patient was seen by this examiner yesterday for some anxiety chest tightness and shortness of breath on and off over the past week who presents again today for same symptoms that seem largely unchanged.  Patient was found yesterday on CTA chest to have some pulmonary hypertension has an appointment with pulmonology tomorrow though he has not yet had a chance to follow-up with [facility] as recommended yesterday.  He denies any symptoms including changing out his chest pain or shortness of breath feels.  No new headache, earache, sore throat, nausea, vomiting, diarrhea, dysuria, rash abdominal pain, back pain, extremity pain, recent falls or injuries or any other drugs.  States that Atarax did not really help much so I gave him yesterday.  No other concerns at this time.          Past Medical History:  Diagnosis Date  . ADHD   . Anxiety   . Asthma   . Bipolar 1 disorder (HCC)   . IBS (irritable bowel syndrome)   . Overdose   . PTSD (post-traumatic stress disorder)   . Suicidal thoughts     Patient Active Problem List   Diagnosis Date Noted  . Acute pain of right shoulder 10/26/2016  . Substance induced mood disorder (HCC) 10/06/2016  . Bipolar 1 disorder (HCC) 10/06/2016    Past Surgical History:  Procedure Laterality Date  . APPENDECTOMY      Prior to Admission medications   Medication Sig Start Date End Date Taking? Authorizing Provider  hydrOXYzine (VISTARIL) 100 MG capsule Take 1 capsule (100 mg total) by mouth 3 (three) times daily as needed for up to 7  days for anxiety. 11/22/20 11/29/20  Gilles Chiquito, MD  ibuprofen (ADVIL,MOTRIN) 800 MG tablet Take 800 mg by mouth 3 (three) times daily as needed. For pain.    [provider]  Multiple Vitamin (MULTIVITAMIN WITH MINERALS) TABS Take 1 tablet by mouth daily.    [provider]  naproxen (NAPROSYN) 500 MG tablet Take 1 tablet (500 mg total) by mouth 2 (two) times daily with a meal. 04/16/16   Sharman Cheek, MD  ranitidine (ZANTAC) 150 MG tablet Take 150 mg by mouth daily as needed. For acid reflux    [provider]    Allergies Geodon [ziprasidone hydrochloride] and Haldol [haloperidol lactate]  No family history on file.  Social History Social History   Tobacco Use  . Smoking status: Former Games developer  . Smokeless tobacco: Never Used  . Tobacco comment: vapes  Substance Use Topics  . Alcohol use: No  . Drug use: No    Review of Systems  Review of Systems  Constitutional: Negative for chills and fever.  HENT: Negative for sore throat.   Eyes: Negative for pain.  Respiratory: Positive for shortness of breath. Negative for cough and stridor.   Cardiovascular: Positive for chest pain.  Gastrointestinal: Negative for vomiting.  Genitourinary: Negative for dysuria.  Musculoskeletal: Negative for myalgias.  Skin: Negative for rash.  Neurological: Negative for seizures, loss of consciousness and headaches.  Psychiatric/Behavioral: Negative for suicidal ideas. The patient is  nervous/anxious.   All other systems reviewed and are negative.     ____________________________________________   PHYSICAL EXAM:  VITAL SIGNS: ED Triage Vitals  Enc Vitals Group     BP 11/23/20 1059 127/74     Pulse Rate 11/23/20 1059 91     Resp 11/23/20 1059 20     Temp 11/23/20 1059 97.7 F (36.5 C)     Temp Source 11/23/20 1059 Oral     SpO2 11/23/20 1059 100 %     Weight 11/23/20 1102 232 lb (105.2 kg)     Height 11/23/20 1102 5\' 10"  (1.778 m)     Head  Circumference --      Peak Flow --      Pain Score 11/23/20 1101 0     Pain Loc --      Pain Edu? --      Excl. in GC? --    Vitals:   11/23/20 1059 11/23/20 1132  BP: 127/74 117/79  Pulse: 91 88  Resp: 20 20  Temp: 97.7 F (36.5 C)   SpO2: 100% 99%   Physical Exam Vitals and nursing note reviewed.  Constitutional:      Appearance: He is well-developed.  HENT:     Head: Normocephalic and atraumatic.     Right Ear: External ear normal.     Left Ear: External ear normal.     Nose: Nose normal.     Mouth/Throat:     Mouth: Mucous membranes are moist.  Eyes:     Conjunctiva/sclera: Conjunctivae normal.  Cardiovascular:     Rate and Rhythm: Normal rate and regular rhythm.     Heart sounds: No murmur heard.   Pulmonary:     Effort: Pulmonary effort is normal. No respiratory distress.     Breath sounds: Normal breath sounds.  Abdominal:     Palpations: Abdomen is soft.     Tenderness: There is no abdominal tenderness.  Musculoskeletal:     Cervical back: Neck supple.  Skin:    General: Skin is warm and dry.     Capillary Refill: Capillary refill takes less than 2 seconds.  Neurological:     Mental Status: He is alert and oriented to person, place, and time.  Psychiatric:        Mood and Affect: Mood normal.      ____________________________________________   LABS (all labs ordered are listed, but only abnormal results are displayed)  Labs Reviewed - No data to display ____________________________________________  EKG  Sinus rhythm with a ventricular of 82, nonspecific ST change in lead I and aVL.  Unchanged when compared to yesterday.  No other evidence of acute ischemia. ____________________________________________  RADIOLOGY  ED MD interpretation: Chest x-ray is unremarkable.  CTA chest without PE but does show evidence of some pulmonary artery dilation.  Official radiology report(s): DG Chest 2 View  Result Date: 11/22/2020 CLINICAL DATA:  Shortness  of breath.  Chest tightness. EXAM: CHEST - 2 VIEW COMPARISON:  Radiographs 01/31/2013. FINDINGS: Slightly lower lung volumes on both views. The heart size and mediastinal contours are normal. The lungs are clear. There is no pleural effusion or pneumothorax. No acute osseous findings are identified. IMPRESSION: Suboptimal inspiration.  No active cardiopulmonary process. Electronically Signed   By: 04/02/2013 M.D.   On: 11/22/2020 14:52   CT Angio Chest PE W and/or Wo Contrast  Result Date: 11/22/2020 CLINICAL DATA:  Anxiety.  Asthma.  Suspected pulmonary embolus. EXAM: CT ANGIOGRAPHY CHEST WITH CONTRAST  TECHNIQUE: Multidetector CT imaging of the chest was performed using the standard protocol during bolus administration of intravenous contrast. Multiplanar CT image reconstructions and MIPs were obtained to evaluate the vascular anatomy. CONTRAST:  4mL OMNIPAQUE IOHEXOL 350 MG/ML SOLN COMPARISON:  None. FINDINGS: Cardiovascular: The thoracic aorta is normal. The heart is unremarkable. No coronary artery calcifications are noted. The main pulmonary artery measures 3.9 cm. Evaluation for pulmonary emboli is limited due to respiratory motion with stairstep artifact. Within these limitations, no emboli are identified. Mediastinum/Nodes: Mild bilateral gynecomastia. No pleural or pericardial effusions. The thyroid is normal. The esophagus is normal. No adenopathy. Lungs/Pleura: Central airways are normal. No pneumothorax. No pulmonary nodules, masses, or focal infiltrates. Upper Abdomen: No acute abnormality. Musculoskeletal: No chest wall abnormality. No acute or significant osseous findings. Review of the MIP images confirms the above findings. IMPRESSION: 1. The main pulmonary artery is dilated to 3.9 cm consistent with pulmonary arterial hypertension. 2. Evaluation for pulmonary emboli is limited due to respiratory motion and stairstep artifact. Within these limitations, no central emboli are identified. 3.  Mild gynecomastia. 4. No other acute abnormalities. Electronically Signed   By: Gerome Sam III M.D   On: 11/22/2020 16:13    ____________________________________________   PROCEDURES  Procedure(s) performed (including Critical Care):  Procedures   ____________________________________________   INITIAL IMPRESSION / ASSESSMENT AND PLAN / ED COURSE        Patient presents with [place] to history exam for assessment of some chest tightness shortness of breath and severe anxiety after being seen by this examiner less than 24 hours prior for similar symptoms.  On arrival he is afebrile and hemodynamically stable.  His lungs are clear bilaterally and he has no evidence of hypoxia today.  He is not suicidal homicidal or psychotic and there is no historical or exam factors to suggest acute traumatic injury or interim acute infectious process.  I again reviewed his work-up from yesterday including normal CBC, CMP, chest x-ray and CTA chest did not show evidence of PE but does show evidence of likely pulmonary hypertension.  Suspect this may be contributing to patient sensation of dyspnea and concrement anxiety is likely not helping symptoms.  Very do not believe he requires repeat CTA chest today is Evalose patient for interim development of PE, pneumothorax, pneumonia, ischemia or other new life-threatening pathology as ECG today is unchanged from prior.  Acutely patient is safe for discharge plan for outpatient pulmonology follow-up tomorrow and [facility] follow-up this afternoon.  Discharged stable condition.  Strict precautions advised and discussed.    ____________________________________________   FINAL CLINICAL IMPRESSION(S) / ED DIAGNOSES  Final diagnoses:  Pulmonary artery hypertension (HCC)  Anxiety    Medications - No data to display   ED Discharge Orders    None       Note:  This document was prepared using Dragon voice recognition software and may include unintentional  dictation errors.   Gilles Chiquito, MD 11/23/20 1200

## 2020-11-23 NOTE — ED Notes (Signed)
See triage note, pt reports anxiety that started this morning. Was seen for same yesterday, did not follow up with recommended providers.  RR even and unlabored. Speaking in complete sentences.  Dr Katrinka Blazing at bedside

## 2020-11-23 NOTE — Discharge Instructions (Addendum)
Please return for any problem.  °

## 2020-11-23 NOTE — ED Provider Notes (Signed)
[facility]-EMERGENCY DEPT Provider Note   CSN: [id number] Arrival date & time: 11/23/20  1539     History Chief Complaint  Patient presents with  . Anxiety    TABB CROGHAN is a 30 y.o. male.  30 year old male with prior medical history as detailed below presents for evaluation.  Patient reports persistent anxious symptoms.  He denies SI or HI.  Patient with multiple recent prior evaluations at [facility] for same complaint.  Patient with extensive medical work-up without significant findings.  Patient complains today of continued anxiety symptoms.  Patient reports symptoms consistent with likely panic attack.  Patient is requesting a one-time dose of Ativan to help with symptoms.  Patient is otherwise without specific complaint.  The history is provided by the patient and medical records.  Anxiety This is a chronic problem. The current episode started more than 1 week ago. The problem occurs daily. The problem has not changed since onset.Pertinent negatives include no chest pain, no abdominal pain, no headaches and no shortness of breath. Nothing aggravates the symptoms. Nothing relieves the symptoms.       Past Medical History:  Diagnosis Date  . ADHD   . Anxiety   . Asthma   . Bipolar 1 disorder (HCC)   . IBS (irritable bowel syndrome)   . Overdose   . PTSD (post-traumatic stress disorder)   . Suicidal thoughts     Patient Active Problem List   Diagnosis Date Noted  . Acute pain of right shoulder 10/26/2016  . Substance induced mood disorder (HCC) 10/06/2016  . Bipolar 1 disorder (HCC) 10/06/2016    Past Surgical History:  Procedure Laterality Date  . APPENDECTOMY         Family History  Problem Relation Age of Onset  . Depression Mother   . Anxiety disorder Mother   . Diabetes Father     Social History   Tobacco Use  . Smoking status: Former Games developer  . Smokeless tobacco: Never Used  . Tobacco comment: vapes   Vaping Use  . Vaping Use: Never used  Substance Use Topics  . Alcohol use: No  . Drug use: No    Home Medications Prior to Admission medications   Medication Sig Start Date End Date Taking? Authorizing Provider  hydrOXYzine (VISTARIL) 100 MG capsule Take 1 capsule (100 mg total) by mouth 3 (three) times daily as needed for up to 7 days for anxiety. 11/22/20 11/29/20  Gilles Chiquito, MD  ibuprofen (ADVIL,MOTRIN) 800 MG tablet Take 800 mg by mouth 3 (three) times daily as needed. For pain.    [provider]  Multiple Vitamin (MULTIVITAMIN WITH MINERALS) TABS Take 1 tablet by mouth daily.    [provider]  naproxen (NAPROSYN) 500 MG tablet Take 1 tablet (500 mg total) by mouth 2 (two) times daily with a meal. 04/16/16   Sharman Cheek, MD  ranitidine (ZANTAC) 150 MG tablet Take 150 mg by mouth daily as needed. For acid reflux    [provider]    Allergies    Geodon [ziprasidone hydrochloride] and Haldol [haloperidol lactate]  Review of Systems   Review of Systems  Respiratory: Negative for shortness of breath.   Cardiovascular: Negative for chest pain.  Gastrointestinal: Negative for abdominal pain.  Neurological: Negative for headaches.  All other systems reviewed and are negative.   Physical Exam Updated Vital Signs BP 130/83   Pulse 82   Temp 98.2 F (36.8 C) (Oral)   Resp  16   Ht 5\' 10"  (1.778 m)   Wt 105.2 kg   SpO2 100%   BMI 33.29 kg/m   Physical Exam Vitals and nursing note reviewed.  Constitutional:      General: He is not in acute distress.    Appearance: He is well-developed.  HENT:     Head: Normocephalic and atraumatic.  Eyes:     Conjunctiva/sclera: Conjunctivae normal.     Pupils: Pupils are equal, round, and reactive to light.  Cardiovascular:     Rate and Rhythm: Normal rate and regular rhythm.     Heart sounds: Normal heart sounds.  Pulmonary:     Effort: Pulmonary effort is normal. No respiratory distress.      Breath sounds: Normal breath sounds.  Abdominal:     General: There is no distension.     Palpations: Abdomen is soft.     Tenderness: There is no abdominal tenderness.  Musculoskeletal:        General: No deformity. Normal range of motion.     Cervical back: Normal range of motion and neck supple.  Skin:    General: Skin is warm and dry.  Neurological:     General: No focal deficit present.     Mental Status: He is alert and oriented to person, place, and time. Mental status is at baseline.  Psychiatric:     Comments: Mildly anxious  Denies SI/HI     ED Results / Procedures / Treatments   Labs (all labs ordered are listed, but only abnormal results are displayed) Labs Reviewed - No data to display  EKG None  Radiology DG Chest 2 View  Result Date: 11/22/2020 CLINICAL DATA:  Shortness of breath.  Chest tightness. EXAM: CHEST - 2 VIEW COMPARISON:  Radiographs 01/31/2013. FINDINGS: Slightly lower lung volumes on both views. The heart size and mediastinal contours are normal. The lungs are clear. There is no pleural effusion or pneumothorax. No acute osseous findings are identified. IMPRESSION: Suboptimal inspiration.  No active cardiopulmonary process. Electronically Signed   By: 04/02/2013 M.D.   On: 11/22/2020 14:52   CT Angio Chest PE W and/or Wo Contrast  Result Date: 11/22/2020 CLINICAL DATA:  Anxiety.  Asthma.  Suspected pulmonary embolus. EXAM: CT ANGIOGRAPHY CHEST WITH CONTRAST TECHNIQUE: Multidetector CT imaging of the chest was performed using the standard protocol during bolus administration of intravenous contrast. Multiplanar CT image reconstructions and MIPs were obtained to evaluate the vascular anatomy. CONTRAST:  49mL OMNIPAQUE IOHEXOL 350 MG/ML SOLN COMPARISON:  None. FINDINGS: Cardiovascular: The thoracic aorta is normal. The heart is unremarkable. No coronary artery calcifications are noted. The main pulmonary artery measures 3.9 cm. Evaluation for pulmonary  emboli is limited due to respiratory motion with stairstep artifact. Within these limitations, no emboli are identified. Mediastinum/Nodes: Mild bilateral gynecomastia. No pleural or pericardial effusions. The thyroid is normal. The esophagus is normal. No adenopathy. Lungs/Pleura: Central airways are normal. No pneumothorax. No pulmonary nodules, masses, or focal infiltrates. Upper Abdomen: No acute abnormality. Musculoskeletal: No chest wall abnormality. No acute or significant osseous findings. Review of the MIP images confirms the above findings. IMPRESSION: 1. The main pulmonary artery is dilated to 3.9 cm consistent with pulmonary arterial hypertension. 2. Evaluation for pulmonary emboli is limited due to respiratory motion and stairstep artifact. Within these limitations, no central emboli are identified. 3. Mild gynecomastia. 4. No other acute abnormalities. Electronically Signed   By: 72m III M.D   On: 11/22/2020 16:13  Procedures Procedures   Medications Ordered in ED Medications  LORazepam (ATIVAN) tablet 1 mg (1 mg Oral Given 11/23/20 1701)    ED Course  I have reviewed the triage vital signs and the nursing notes.  Pertinent labs & imaging results that were available during my care of the patient were reviewed by me and considered in my medical decision making (see chart for details).    MDM Rules/Calculators/A&P                          MDM  MSE Complete  JAMONTE CURFMAN was evaluated in Emergency Department on 11/23/2020 for the symptoms described in the history of present illness. He was evaluated in the context of the global COVID-19 pandemic, which necessitated consideration that the patient might be at risk for infection with the SARS-CoV-2 virus that causes COVID-19. Institutional protocols and algorithms that pertain to the evaluation of patients at risk for COVID-19 are in a state of rapid change based on information released by regulatory bodies including  the CDC and federal and state organizations. These policies and algorithms were followed during the patient's care in the ED.  Patient is presenting for evaluation of reported anxiety.  Patient without other significant complaint.  Patient does understand need for close outpatient follow-up.  Strict return precautions given and understood.  Portance of close follow-up is stressed.   Final Clinical Impression(s) / ED Diagnoses Final diagnoses:  Panic attack    Rx / DC Orders ED Discharge Orders    None       Wynetta Fines, MD 11/23/20 1706

## 2020-11-23 NOTE — ED Notes (Signed)
I did an ekg on him; pt continues to state that he is having anxiety chest pain that is tight;

## 2020-11-23 NOTE — ED Notes (Signed)
Pt continuing to c/o shob and chest tightness. RR unlabored and even, speaking in  Complete sentences.  Dr Katrinka Blazing to bedside, ok for d/c

## 2020-11-23 NOTE — ED Triage Notes (Signed)
Per EMS-patient went to [facility] this am for same symptoms-cleared him-states they didn't "give him what he wanted" so he called 911 and requested to come here-ED would not give him ativan

## 2020-11-27 ENCOUNTER — Emergency Department
Admission: EM | Admit: 2020-11-27 | Discharge: 2020-11-27 | Disposition: A | Discharge status: Home / Self Care | Payer: BC Managed Care – PPO | Attending: Emergency Medicine | Admitting: Emergency Medicine

## 2020-11-27 ENCOUNTER — Emergency Department: Payer: BC Managed Care – PPO

## 2020-11-27 ENCOUNTER — Encounter: Payer: Self-pay | Admitting: Emergency Medicine

## 2020-11-27 ENCOUNTER — Other Ambulatory Visit: Payer: Self-pay

## 2020-11-27 DIAGNOSIS — J45909 Unspecified asthma, uncomplicated: Secondary | ICD-10-CM | POA: Insufficient documentation

## 2020-11-27 DIAGNOSIS — R0781 Pleurodynia: Secondary | ICD-10-CM | POA: Insufficient documentation

## 2020-11-27 DIAGNOSIS — R1012 Left upper quadrant pain: Secondary | ICD-10-CM | POA: Insufficient documentation

## 2020-11-27 DIAGNOSIS — Z87891 Personal history of nicotine dependence: Secondary | ICD-10-CM | POA: Diagnosis not present

## 2020-11-27 DIAGNOSIS — B3781 Candidal esophagitis: Secondary | ICD-10-CM

## 2020-11-27 DIAGNOSIS — Z7951 Long term (current) use of inhaled steroids: Secondary | ICD-10-CM | POA: Insufficient documentation

## 2020-11-27 DIAGNOSIS — B370 Candidal stomatitis: Secondary | ICD-10-CM

## 2020-11-27 DIAGNOSIS — R109 Unspecified abdominal pain: Secondary | ICD-10-CM | POA: Diagnosis present

## 2020-11-27 LAB — CBC WITH DIFFERENTIAL/PLATELET
Abs Immature Granulocytes: 0.05 10*3/uL (ref 0.00–0.07)
Basophils Absolute: 0.1 10*3/uL (ref 0.0–0.1)
Basophils Relative: 1 %
Eosinophils Absolute: 0.1 10*3/uL (ref 0.0–0.5)
Eosinophils Relative: 1 %
HCT: 42.6 % (ref 39.0–52.0)
Hemoglobin: 14.7 g/dL (ref 13.0–17.0)
Immature Granulocytes: 1 %
Lymphocytes Relative: 26 %
Lymphs Abs: 2.8 10*3/uL (ref 0.7–4.0)
MCH: 28.8 pg (ref 26.0–34.0)
MCHC: 34.5 g/dL (ref 30.0–36.0)
MCV: 83.4 fL (ref 80.0–100.0)
Monocytes Absolute: 0.5 10*3/uL (ref 0.1–1.0)
Monocytes Relative: 5 %
Neutro Abs: 7.2 10*3/uL (ref 1.7–7.7)
Neutrophils Relative %: 66 %
Platelets: 296 10*3/uL (ref 150–400)
RBC: 5.11 MIL/uL (ref 4.22–5.81)
RDW: 12.5 % (ref 11.5–15.5)
WBC: 10.7 10*3/uL — ABNORMAL HIGH (ref 4.0–10.5)
nRBC: 0 % (ref 0.0–0.2)

## 2020-11-27 LAB — COMPREHENSIVE METABOLIC PANEL
ALT: 47 U/L — ABNORMAL HIGH (ref 0–44)
AST: 23 U/L (ref 15–41)
Albumin: 4.6 g/dL (ref 3.5–5.0)
Alkaline Phosphatase: 122 U/L (ref 38–126)
Anion gap: 10 (ref 5–15)
BUN: 14 mg/dL (ref 6–20)
CO2: 25 mmol/L (ref 22–32)
Calcium: 9.2 mg/dL (ref 8.9–10.3)
Chloride: 102 mmol/L (ref 98–111)
Creatinine, Ser: 0.89 mg/dL (ref 0.61–1.24)
GFR, Estimated: >60 mL/min (ref >60)
Glucose, Bld: 100 mg/dL — ABNORMAL HIGH (ref 70–99)
Potassium: 4 mmol/L (ref 3.5–5.1)
Sodium: 137 mmol/L (ref 135–145)
Total Bilirubin: 0.3 mg/dL (ref 0.3–1.2)
Total Protein: 7.6 g/dL (ref 6.5–8.1)

## 2020-11-27 LAB — TSH: TSH: 2.449 u[IU]/mL (ref 0.350–4.500)

## 2020-11-27 LAB — LIPASE, BLOOD: Lipase: 34 U/L (ref 11–51)

## 2020-11-27 LAB — T4, FREE: Free T4: 0.92 ng/dL (ref 0.61–1.12)

## 2020-11-27 MED ORDER — IOHEXOL 300 MG/ML  SOLN
100.0000 mL | Freq: Once | INTRAMUSCULAR | Status: AC | PRN
  Administered 2020-11-27: 100 mL via INTRAVENOUS

## 2020-11-27 NOTE — ED Triage Notes (Signed)
Pt to ED via POV with c/o abd pain, pt states L sided flank pain, states has been ongoing x 1 week but has been "overshadowed by really bad anxiety". Pt states "they did a urine dipstick but it was okay". Pt states had follow up echo this morning from pulmonary follow up.   Pt also c/o increasing fatigue and worsening abd pain that is worse after eating.

## 2020-11-27 NOTE — ED Provider Notes (Addendum)
[facility] Emergency Department Provider Note   ____________________________________________   Event Date/Time   First MD Initiated Contact with Patient 11/27/20 1118     (approximate)  I have reviewed the triage vital signs and the nursing notes.   HISTORY  Chief Complaint Abdominal Pain    HPI [name] is a 30 y.o. male with a stated past medical history of IBS, PTSD, and bipolar disorder who presents for left-sided flank/lower rib cage pain that began approximately 1 week prior to arrival.  Patient states that this pain has been very slightly improved, nonradiating, aching, 5/10 in severity, and worse with deep inspiration or palpation.  Patient also states that he has thrush currently and is being treated with fluconazole but it makes him not want to eat.  Patient currently denies any vision changes, tinnitus, difficulty speaking, facial droop, sore throat, chest pain, shortness of breath,  nausea/vomiting/diarrhea, dysuria, or weakness/numbness/paresthesias in any extremity         Past Medical History:  Diagnosis Date  . ADHD   . Anxiety   . Asthma   . Bipolar 1 disorder (HCC)   . IBS (irritable bowel syndrome)   . Overdose   . PTSD (post-traumatic stress disorder)   . Suicidal thoughts     Patient Active Problem List   Diagnosis Date Noted  . Acute pain of right shoulder 10/26/2016  . Substance induced mood disorder (HCC) 10/06/2016  . Bipolar 1 disorder (HCC) 10/06/2016    Past Surgical History:  Procedure Laterality Date  . APPENDECTOMY      Prior to Admission medications   Medication Sig Start Date End Date Taking? Authorizing Provider  acetaminophen (TYLENOL) 500 MG tablet Take 500 mg by mouth every 6 (six) hours as needed for headache, fever, moderate pain or mild pain.   Yes [provider]  hydrOXYzine (ATARAX/VISTARIL) 25 MG tablet Take 25 mg by mouth every 4 (four) hours as needed for anxiety or  itching.   Yes [provider]  ibuprofen (ADVIL,MOTRIN) 800 MG tablet Take 800 mg by mouth 3 (three) times daily as needed. For pain.   Yes [provider]  Multiple Vitamin (MULTIVITAMIN WITH MINERALS) TABS Take 1 tablet by mouth daily.   Yes [provider]  naproxen (NAPROSYN) 500 MG tablet Take 1 tablet (500 mg total) by mouth 2 (two) times daily with a meal. 04/16/16  Yes Sharman Cheek, MD  omeprazole (PRILOSEC) 20 MG capsule Take 20 mg by mouth daily.   Yes [provider]  FLOVENT HFA 110 MCG/ACT inhaler Inhale 2 puffs into the lungs in the morning and at bedtime. 11/24/20   [provider]  hydrOXYzine (VISTARIL) 100 MG capsule Take 1 capsule (100 mg total) by mouth 3 (three) times daily as needed for up to 7 days for anxiety. Patient not taking: No sig reported 11/22/20 11/29/20  Gilles Chiquito, MD  ranitidine (ZANTAC) 150 MG tablet Take 150 mg by mouth daily as needed. For acid reflux Patient not taking: Reported on 11/27/2020    [provider]    Allergies Geodon [ziprasidone hydrochloride], Haldol [haloperidol lactate], Risperidone, and Ziprasidone  Family History  Problem Relation Age of Onset  . Depression Mother   . Anxiety disorder Mother   . Diabetes Father     Social History Social History   Tobacco Use  . Smoking status: Former Games developer  . Smokeless tobacco: Never Used  . Tobacco comment: vapes  Vaping Use  .  Vaping Use: Never used  Substance Use Topics  . Alcohol use: No  . Drug use: No    Review of Systems Constitutional: No fever/chills Eyes: No visual changes. ENT: No sore throat. Cardiovascular: Denies chest pain. Respiratory: Denies shortness of breath. Gastrointestinal: Endorses left upper quadrant/left flank/and lower costal border abdominal pain.  No nausea, no vomiting.  No diarrhea. Genitourinary: Negative for dysuria. Musculoskeletal: Negative for acute arthralgias Skin: Negative for  rash. Neurological: Negative for headaches, weakness/numbness/paresthesias in any extremity Psychiatric: Negative for suicidal ideation/homicidal ideation   ____________________________________________   PHYSICAL EXAM:  VITAL SIGNS: ED Triage Vitals  Enc Vitals Group     BP 11/27/20 1115 111/79     Pulse Rate 11/27/20 1115 81     Resp 11/27/20 1115 16     Temp 11/27/20 1115 98.3 F (36.8 C)     Temp Source 11/27/20 1115 Oral     SpO2 11/27/20 1115 99 %     Weight --      Height --      Head Circumference --      Peak Flow --      Pain Score 11/27/20 1116 5     Pain Loc --      Pain Edu? --      Excl. in GC? --    Constitutional: Alert and oriented. Well appearing and in no acute distress. Eyes: Conjunctivae are normal. PERRL. Head: Atraumatic. Nose: No congestion/rhinnorhea. Mouth/Throat: Mucous membranes are moist. Neck: No stridor Cardiovascular: Grossly normal heart sounds.  Good peripheral circulation. Respiratory: Normal respiratory effort.  No retractions. Gastrointestinal: Soft and tender to palpation over left lateral 11th and 12th rib. No distention. Musculoskeletal: No obvious deformities Neurologic:  Normal speech and language. No gross focal neurologic deficits are appreciated. Skin:  Skin is warm and dry. No rash noted. Psychiatric: Mood and affect are normal. Speech and behavior are normal.  ____________________________________________   LABS (all labs ordered are listed, but only abnormal results are displayed)  Labs Reviewed  COMPREHENSIVE METABOLIC PANEL - Abnormal; Notable for the following components:      Result Value   Glucose, Bld 100 (*)    ALT 47 (*)    All other components within normal limits  CBC WITH DIFFERENTIAL/PLATELET - Abnormal; Notable for the following components:   WBC 10.7 (*)    All other components within normal limits  LIPASE, BLOOD  TSH  T4, FREE   RADIOLOGY  ED MD interpretation: CT of the abdomen and pelvis with  IV contrast shows scattered sigmoid diverticula without evidence of diverticulitis  Official radiology report(s): CT Abdomen Pelvis W Contrast  Result Date: 11/27/2020 CLINICAL DATA:  Abdominal pain, primarily left-sided EXAM: CT ABDOMEN AND PELVIS WITH CONTRAST TECHNIQUE: Multidetector CT imaging of the abdomen and pelvis was performed using the standard protocol following bolus administration of intravenous contrast. CONTRAST:  OMNIPAQUE IOHEXOL 300 MG/ML  SOLN COMPARISON:  October 24, 2017 FINDINGS: Lower chest: Lung bases are clear. Hepatobiliary: A small portion of the dome of the liver is not imaged. No focal liver lesions are evident in regions visualized. There is no appreciable gallbladder wall thickening. No evident biliary duct dilatation. Pancreas: No pancreatic mass or inflammatory focus evident. Spleen: No splenic lesions appreciable. Adrenals/Urinary Tract: Adrenals bilaterally appear normal. There is no evident renal mass or hydronephrosis on either side. There is no evident renal or ureteral calculus on either side. Urinary bladder is midline with wall thickness within normal limits. Stomach/Bowel: There are scattered  sigmoid diverticula without diverticulitis. There is no appreciable bowel wall or mesenteric thickening. There is no demonstrable bowel obstruction. Terminal ileum appears normal. Appendix is absent. No periappendiceal region inflammation. There is no evident abscess or ascites in the abdomen or pelvis. Vascular/Lymphatic: There is no abdominal aortic aneurysm. No arterial vascular lesions are evident. There is a circumaortic left renal vein, an anatomic variant. Major venous structures appear patent. No evident adenopathy in the abdomen or pelvis. Scattered subcentimeter inguinal lymph nodes are considered nonspecific. Reproductive: Prostate and seminal vesicles are normal in the size and configuration. Other: No abscess or ascites evident in the abdomen or pelvis.  Musculoskeletal: No blastic or lytic bone lesions. No intramuscular or abdominal wall lesions evident. IMPRESSION: 1. Scattered sigmoid diverticula without diverticulitis. No evident bowel wall thickening or bowel obstruction. No abscess in the abdomen or pelvis. Appendix absent. No periappendiceal region inflammation. 2. No renal or ureteral calculi. No hydronephrosis. Urinary bladder wall thickness normal. Electronically Signed   By: Bretta Bang III M.D.   On: 11/27/2020 14:01    ____________________________________________   PROCEDURES  Procedure(s) performed (including Critical Care):  .1-3 Lead EKG Interpretation Performed by: Merwyn Katos, MD Authorized by: Merwyn Katos, MD     Interpretation: normal     ECG rate:  72   ECG rate assessment: normal     Rhythm: sinus rhythm     Ectopy: none     Conduction: normal       ____________________________________________   INITIAL IMPRESSION / ASSESSMENT AND PLAN / ED COURSE  As part of my medical decision making, I reviewed the following data within the electronic MEDICAL RECORD NUMBER Nursing notes reviewed and incorporated, Labs reviewed, EKG interpreted, Old chart reviewed, Radiograph reviewed and Notes from prior ED visits reviewed and incorporated        Patient presents for abdominal pain.  Differential diagnosis includes appendicitis, abdominal aortic aneurysm, surgical biliary disease, pancreatitis, SBO, mesenteric ischemia, serious intra-abdominal bacterial illness, genital torsion. Doubt atypical ACS. Based on history, physical exam, radiologic/laboratory evaluation, there is no red flag results or symptomatology requiring emergent intervention or need for admission at this time Pt tolerating PO. Given significant tenderness to palpation over the rib, concern for musculoskeletal cause of patient's pain. Disposition: Patient will be discharged with strict return precautions and follow up with primary MD within  12-24 hours for further evaluation. Patient understands that this still may have an early presentation of an emergent medical condition such as appendicitis that will require a recheck.      ____________________________________________   FINAL CLINICAL IMPRESSION(S) / ED DIAGNOSES  Final diagnoses:  Left upper quadrant abdominal pain  Thrush of mouth and esophagus [facility])     ED Discharge Orders    None       Note:  This document was prepared using Dragon voice recognition software and may include unintentional dictation errors.   Merwyn Katos, MD 11/27/20 1447    Merwyn Katos, MD 11/27/20 [phone]

## 2021-04-13 ENCOUNTER — Encounter: Payer: Self-pay | Admitting: Emergency Medicine

## 2021-04-13 ENCOUNTER — Other Ambulatory Visit: Payer: Self-pay

## 2021-04-13 ENCOUNTER — Emergency Department: Payer: BLUE CROSS/BLUE SHIELD

## 2021-04-13 DIAGNOSIS — R42 Dizziness and giddiness: Secondary | ICD-10-CM | POA: Insufficient documentation

## 2021-04-13 DIAGNOSIS — R0789 Other chest pain: Secondary | ICD-10-CM | POA: Insufficient documentation

## 2021-04-13 DIAGNOSIS — Z5321 Procedure and treatment not carried out due to patient leaving prior to being seen by health care provider: Secondary | ICD-10-CM | POA: Insufficient documentation

## 2021-04-13 LAB — CBC
HCT: 42.9 % (ref 39.0–52.0)
Hemoglobin: 14.8 g/dL (ref 13.0–17.0)
MCH: 29.7 pg (ref 26.0–34.0)
MCHC: 34.5 g/dL (ref 30.0–36.0)
MCV: 86.1 fL (ref 80.0–100.0)
Platelets: 310 10*3/uL (ref 150–400)
RBC: 4.98 MIL/uL (ref 4.22–5.81)
RDW: 12.9 % (ref 11.5–15.5)
WBC: 10.7 10*3/uL — ABNORMAL HIGH (ref 4.0–10.5)
nRBC: 0 % (ref 0.0–0.2)

## 2021-04-13 LAB — BASIC METABOLIC PANEL
Anion gap: 10 (ref 5–15)
BUN: 11 mg/dL (ref 6–20)
CO2: 25 mmol/L (ref 22–32)
Calcium: 9.6 mg/dL (ref 8.9–10.3)
Chloride: 104 mmol/L (ref 98–111)
Creatinine, Ser: 0.83 mg/dL (ref 0.61–1.24)
GFR, Estimated: >60 mL/min (ref >60)
Glucose, Bld: 131 mg/dL — ABNORMAL HIGH (ref 70–99)
Potassium: 3.9 mmol/L (ref 3.5–5.1)
Sodium: 139 mmol/L (ref 135–145)

## 2021-04-13 LAB — TROPONIN I (HIGH SENSITIVITY)
Troponin I (High Sensitivity): <2 ng/L (ref <18)
Troponin I (High Sensitivity): 3 ng/L (ref <18)

## 2021-04-13 NOTE — ED Triage Notes (Signed)
Pt reports that he developed chest tightness all day today and about an hour PTA he got dizzy. He is feeling tense in his shoulders. He has a stressful week and thinks this may be part of it. He denies N/V, diaphoresis, or radiation.

## 2021-04-14 ENCOUNTER — Emergency Department
Admission: EM | Admit: 2021-04-14 | Discharge: 2021-04-14 | Disposition: A | Discharge status: Home / Self Care | Payer: BLUE CROSS/BLUE SHIELD | Attending: Emergency Medicine | Admitting: Emergency Medicine

## 2021-05-13 IMAGING — CT CT ABD-PELV W/ CM
2 of 4 series · 16 of 46 positions shown, 18 images · IV contrast (APPLIED)
Comparison: October 24, 2017

CLINICAL DATA: Abdominal pain, primarily left-sided

EXAM:
CT ABDOMEN AND PELVIS WITH CONTRAST
TECHNIQUE: Multidetector CT imaging of the abdomen and pelvis was performed
using the standard protocol following bolus administration of
intravenous contrast.
CONTRAST:  100mL OMNIPAQUE IOHEXOL 300 MG/ML  SOLN

[Series 2: routine abd/pel with · axial · 0.79mm/px · z∈[-314,+126]mm · 13 of 97 slices shown, 15 images]
[im 5/97  soft-tissue]
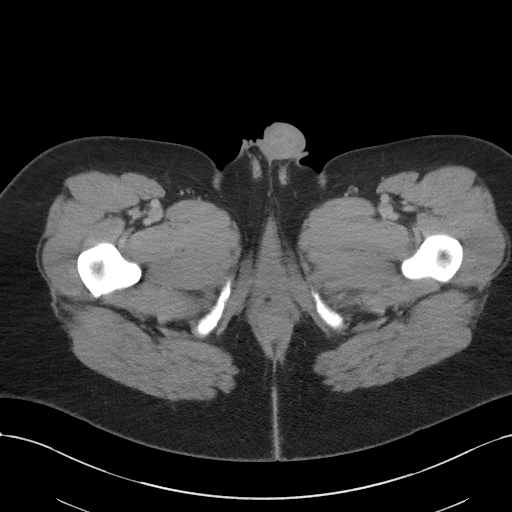
[im 5/97  bone]
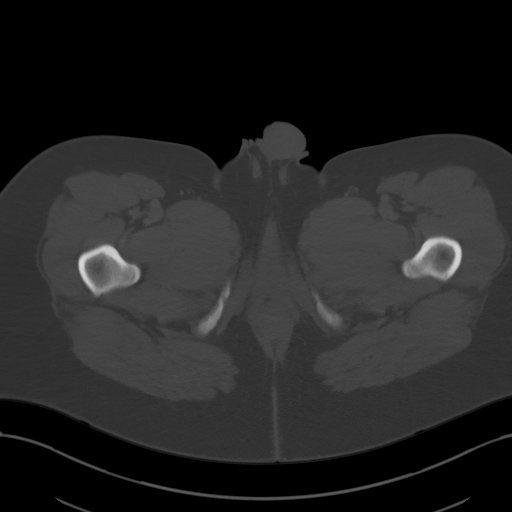
[im 13/97  soft-tissue]
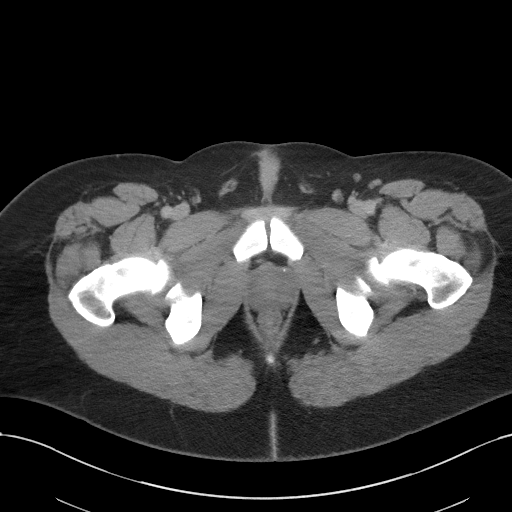
[im 21/97  soft-tissue]
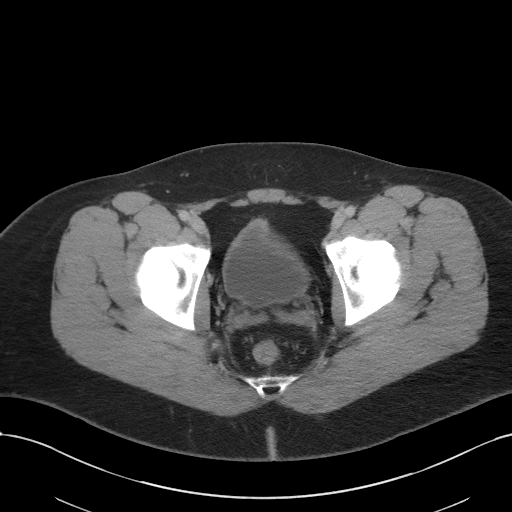
[im 29/97  soft-tissue]
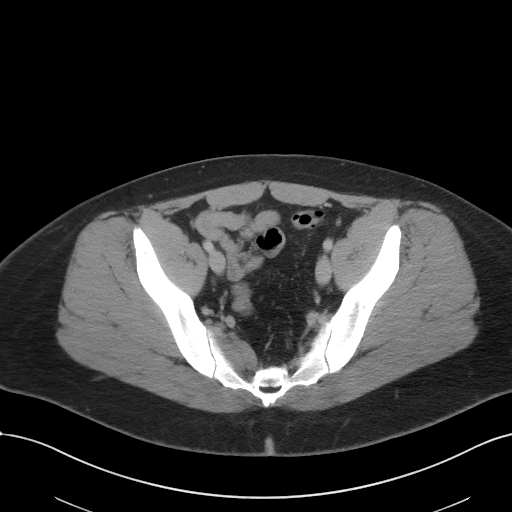
[im 33/97  soft-tissue]
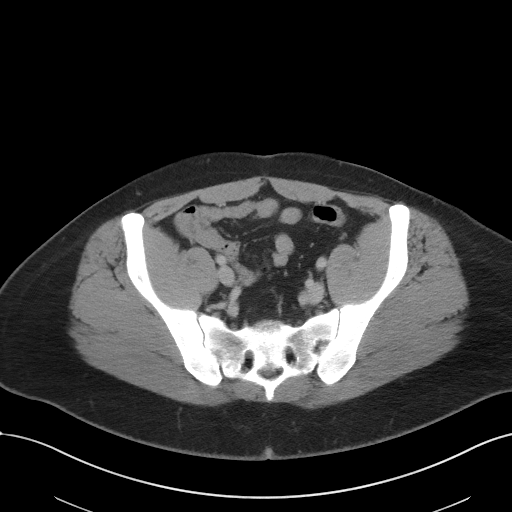
[im 41/97  soft-tissue]
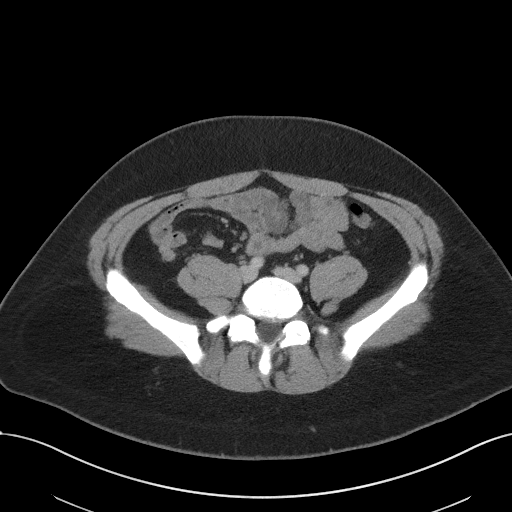
[im 49/97  soft-tissue]
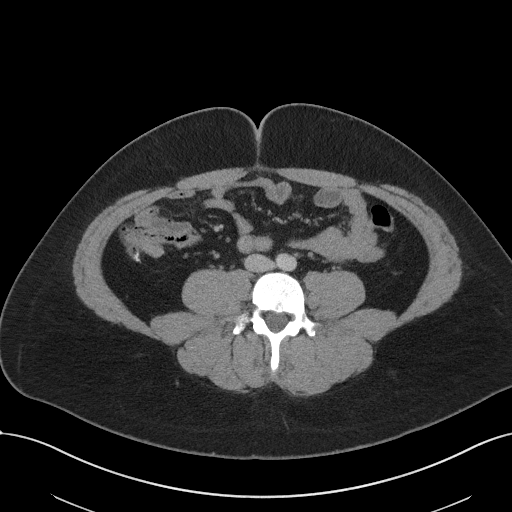
[im 57/97  soft-tissue]
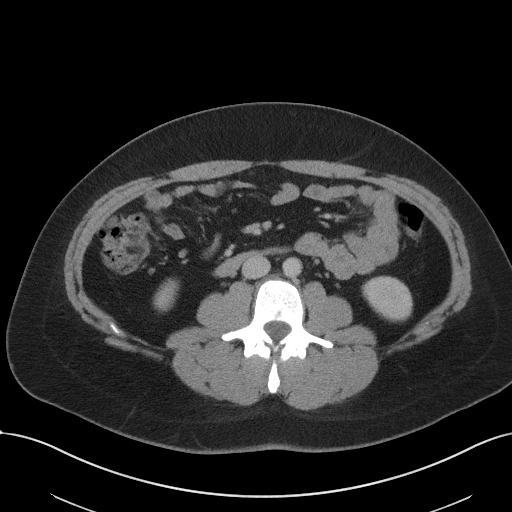
[im 65/97  soft-tissue]
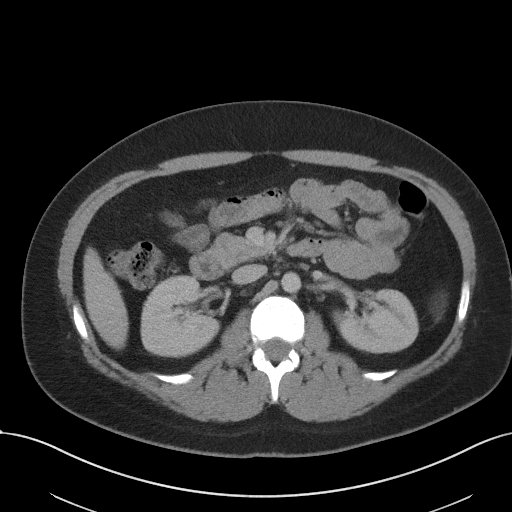
[im 65/97  bone]
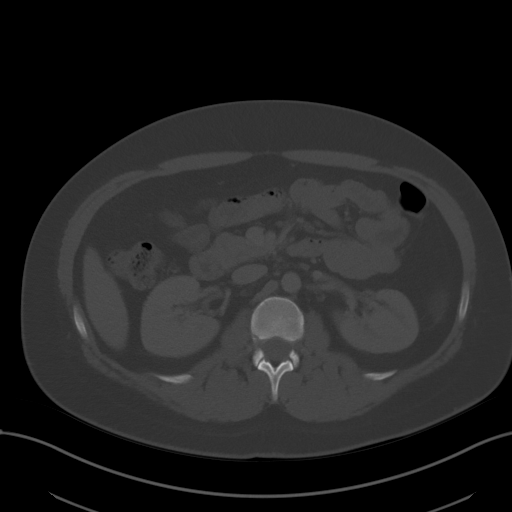
[im 69/97  soft-tissue]
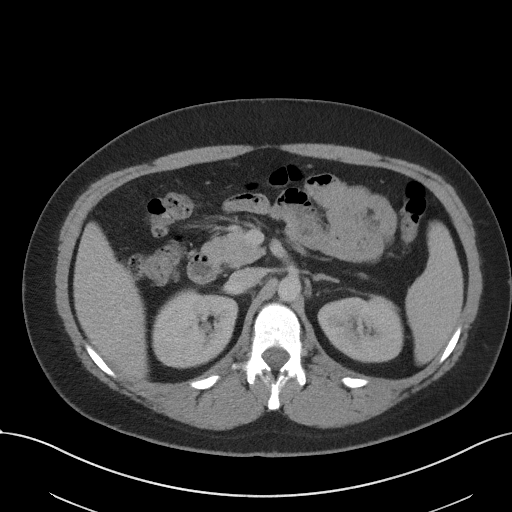
[im 77/97  soft-tissue]
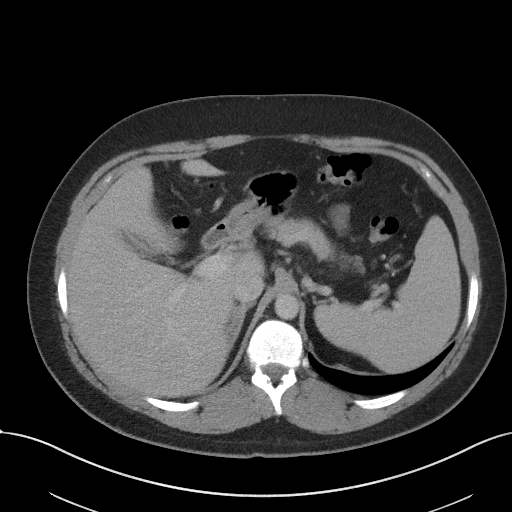
[im 85/97  soft-tissue]
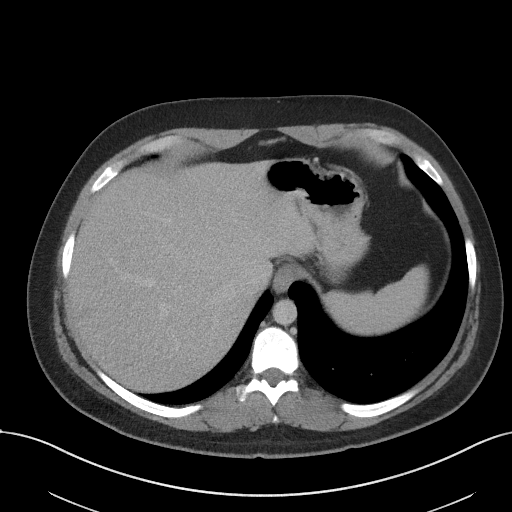
[im 93/97  soft-tissue]
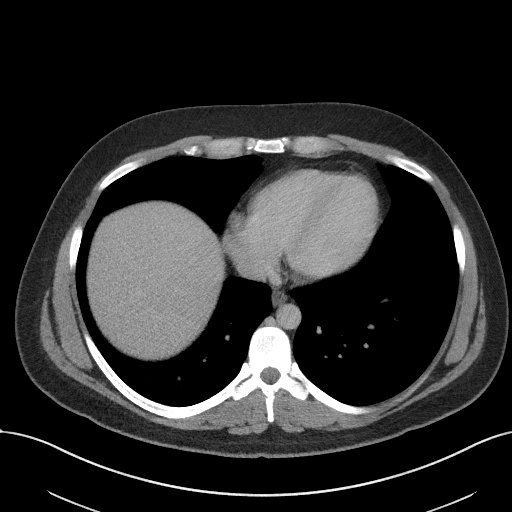

[Series 5: coronal st · coronal · 0.95mm/px · 3 of 97 slices shown]
[im 33/97  soft-tissue]
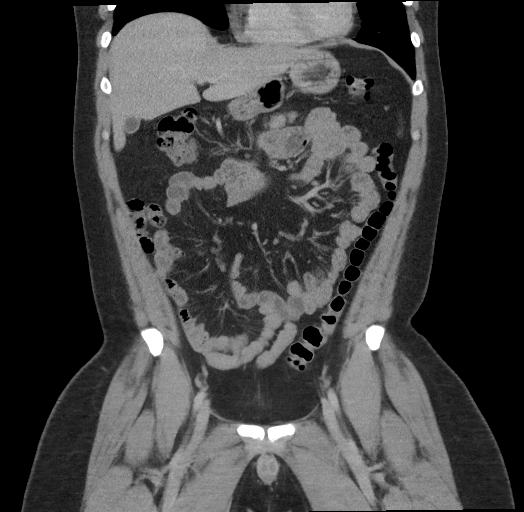
[im 43/97  soft-tissue]
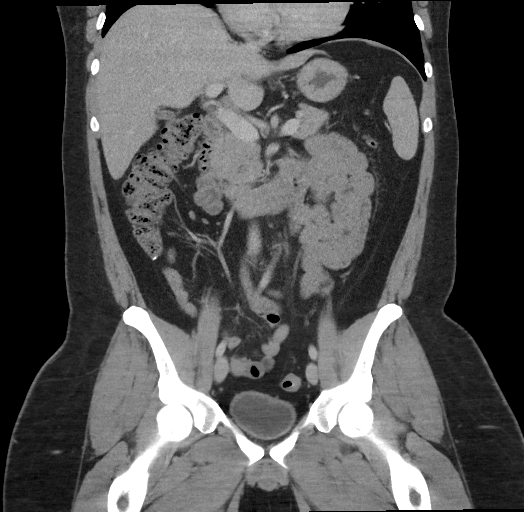
[im 54/97  soft-tissue]
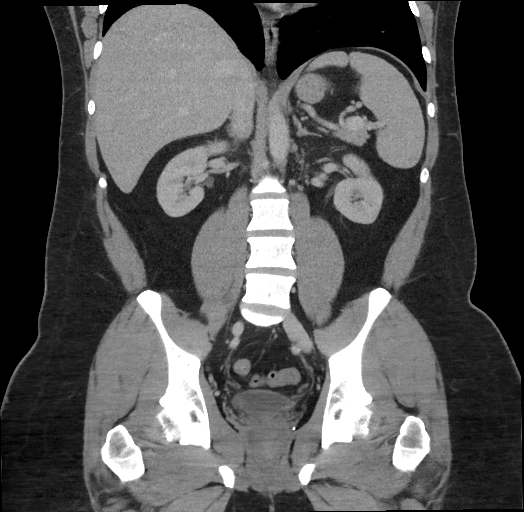

[16 of 46 positions shown; findings below may reference images not displayed]

FINDINGS: Lower chest: Lung bases are clear.

Hepatobiliary: A small portion of the dome of the liver is not
imaged. No focal liver lesions are evident in regions visualized.
There is no appreciable gallbladder wall thickening. No evident
biliary duct dilatation.

Pancreas: No pancreatic mass or inflammatory focus evident.

Spleen: No splenic lesions appreciable.

Adrenals/Urinary Tract: Adrenals bilaterally appear normal. There is
no evident renal mass or hydronephrosis on either side. There is no
evident renal or ureteral calculus on either side. Urinary bladder
is midline with wall thickness within normal limits.

Stomach/Bowel: There are scattered sigmoid diverticula without
diverticulitis. There is no appreciable bowel wall or mesenteric
thickening. There is no demonstrable bowel obstruction. Terminal
ileum appears normal. Appendix is absent. No periappendiceal region
inflammation. There is no evident abscess or ascites in the abdomen
or pelvis.

Vascular/Lymphatic: There is no abdominal aortic aneurysm. No
arterial vascular lesions are evident. There is a circumaortic left
renal vein, an anatomic variant. Major venous structures appear
patent. No evident adenopathy in the abdomen or pelvis. Scattered
subcentimeter inguinal lymph nodes are considered nonspecific.

Reproductive: Prostate and seminal vesicles are normal in the size
and configuration.

Other: No abscess or ascites evident in the abdomen or pelvis.

Musculoskeletal: No blastic or lytic bone lesions. No intramuscular
or abdominal wall lesions evident.
IMPRESSION: 1. Scattered sigmoid diverticula without diverticulitis. No evident
bowel wall thickening or bowel obstruction. No abscess in the
abdomen or pelvis. Appendix absent. No periappendiceal region
inflammation.

2. No renal or ureteral calculi. No hydronephrosis. Urinary bladder
wall thickness normal.

## 2022-05-22 ENCOUNTER — Emergency Department: Payer: BC Managed Care – PPO

## 2022-05-22 ENCOUNTER — Emergency Department
Admission: EM | Admit: 2022-05-22 | Discharge: 2022-05-22 | Disposition: A | Discharge status: Home / Self Care | Payer: BC Managed Care – PPO | Attending: Emergency Medicine | Admitting: Emergency Medicine

## 2022-05-22 ENCOUNTER — Other Ambulatory Visit: Payer: Self-pay

## 2022-05-22 ENCOUNTER — Encounter: Payer: Self-pay | Admitting: Emergency Medicine

## 2022-05-22 DIAGNOSIS — R0789 Other chest pain: Secondary | ICD-10-CM | POA: Diagnosis not present

## 2022-05-22 DIAGNOSIS — R079 Chest pain, unspecified: Secondary | ICD-10-CM

## 2022-05-22 LAB — CBC
HCT: 40.1 % (ref 39.0–52.0)
Hemoglobin: 13.4 g/dL (ref 13.0–17.0)
MCH: 28.3 pg (ref 26.0–34.0)
MCHC: 33.4 g/dL (ref 30.0–36.0)
MCV: 84.8 fL (ref 80.0–100.0)
Platelets: 277 10*3/uL (ref 150–400)
RBC: 4.73 MIL/uL (ref 4.22–5.81)
RDW: 12.9 % (ref 11.5–15.5)
WBC: 9.3 10*3/uL (ref 4.0–10.5)
nRBC: 0 % (ref 0.0–0.2)

## 2022-05-22 LAB — BASIC METABOLIC PANEL
Anion gap: 8 (ref 5–15)
BUN: 13 mg/dL (ref 6–20)
CO2: 25 mmol/L (ref 22–32)
Calcium: 9 mg/dL (ref 8.9–10.3)
Chloride: 107 mmol/L (ref 98–111)
Creatinine, Ser: 0.9 mg/dL (ref 0.61–1.24)
GFR, Estimated: >60 mL/min (ref >60)
Glucose, Bld: 100 mg/dL — ABNORMAL HIGH (ref 70–99)
Potassium: 3.6 mmol/L (ref 3.5–5.1)
Sodium: 140 mmol/L (ref 135–145)

## 2022-05-22 LAB — TROPONIN I (HIGH SENSITIVITY)
Troponin I (High Sensitivity): 2 ng/L (ref <18)
Troponin I (High Sensitivity): 3 ng/L (ref <18)

## 2022-05-22 NOTE — ED Notes (Signed)
Patient reports he has been more anxious since starting antibiotic on Thursday and concern it could be a side effect of the med.  Patient calm and cooperative at this time.  Patient also reports chest pain that he feels is anxiety related.

## 2022-05-22 NOTE — ED Provider Notes (Signed)
[facility] Provider Note    Event Date/Time   First MD Initiated Contact with Patient 05/22/22 1947     (approximate)   History   Chest Pain   HPI  [name] is a 31 y.o. male who presented to the emergency department today because of concerns for chest pain.  Located in the center and left chest.  The pain started today.  Had somewhat improved by the time my exam.  He states that he does have history of anxiety.  He has been out of his medication for a while because of issues with insurance.  The patient denies any recent illness.     Physical Exam   Triage Vital Signs: ED Triage Vitals  Enc Vitals Group     BP 05/22/22 1844 132/77     Pulse Rate 05/22/22 1844 77     Resp 05/22/22 1844 16     Temp 05/22/22 1844 98.2 F (36.8 C)     Temp Source 05/22/22 1844 Oral     SpO2 05/22/22 1844 96 %     Weight 05/22/22 1839 238 lb 1.6 oz (108 kg)     Height 05/22/22 1839 5\' 10"  (1.778 m)     Head Circumference --      Peak Flow --      Pain Score 05/22/22 1839 7     Pain Loc --      Pain Edu? --      Excl. in [place]? --     Most recent vital signs: Vitals:   05/22/22 1844  BP: 132/77  Pulse: 77  Resp: 16  Temp: 98.2 F (36.8 C)  SpO2: 96%    General: Awake, alert, oriented. CV:  Good peripheral perfusion. Regular rate and rhythm. Resp:  Normal effort. Lungs clear. Abd:  No distention.     ED Results / Procedures / Treatments   Labs (all labs ordered are listed, but only abnormal results are displayed) Labs Reviewed  BASIC METABOLIC PANEL - Abnormal; Notable for the following components:      Result Value   Glucose, Bld 100 (*)    All other components within normal limits  CBC  TROPONIN I (HIGH SENSITIVITY)     EKG  I, Nance Pear, attending physician, personally viewed and interpreted this EKG  EKG Time: 1843 Rate: 82 Rhythm: normal sinus rhythm Axis: normal Intervals: qtc 460 QRS: narrow ST changes: no st  elevation Impression: normal ekg  RADIOLOGY I independently interpreted and visualized the CXR. My interpretation: No pneumonia. No pneumothorax. Radiology interpretation:  IMPRESSION:  No acute abnormality of the lungs.      PROCEDURES:  Critical Care performed: No  Procedures   MEDICATIONS ORDERED IN ED: Medications - No data to display   IMPRESSION / MDM / [place] / ED COURSE  I reviewed the triage vital signs and the nursing notes.                              Differential diagnosis includes, but is not limited to, acs, pneumonia, anxiety.  Patient's presentation is most consistent with acute presentation with potential threat to life or bodily function.  Patient presented to the emergency department today because of concerns for chest pain.  Troponin was negative x2.  Chest x-ray without concern for pneumonia or pneumothorax.  Patient does have a history of anxiety.  At this time I think likely patient's  pain is related to anxiety.  Discussed this with the patient.  Will give [facility] information.  FINAL CLINICAL IMPRESSION(S) / ED DIAGNOSES   Final diagnoses:  Nonspecific chest pain     Note:  This document was prepared using Dragon voice recognition software and may include unintentional dictation errors.    Nance Pear, MD 05/22/22 2245

## 2022-05-22 NOTE — Discharge Instructions (Signed)
Please seek medical attention for any high fevers, chest pain, shortness of breath, change in behavior, persistent vomiting, bloody stool or any other new or concerning symptoms.  

## 2022-05-22 NOTE — ED Notes (Signed)
Patient to xray.

## 2022-08-15 ENCOUNTER — Other Ambulatory Visit: Payer: Self-pay

## 2022-08-15 ENCOUNTER — Emergency Department: Payer: Self-pay

## 2022-08-15 ENCOUNTER — Emergency Department
Admission: EM | Admit: 2022-08-15 | Discharge: 2022-08-15 | Disposition: A | Discharge status: Home / Self Care | Payer: Self-pay | Attending: Emergency Medicine | Admitting: Emergency Medicine

## 2022-08-15 DIAGNOSIS — R42 Dizziness and giddiness: Secondary | ICD-10-CM | POA: Insufficient documentation

## 2022-08-15 DIAGNOSIS — R519 Headache, unspecified: Secondary | ICD-10-CM | POA: Insufficient documentation

## 2022-08-15 LAB — CBC
HCT: 42.4 % (ref 39.0–52.0)
Hemoglobin: 14.2 g/dL (ref 13.0–17.0)
MCH: 28.1 pg (ref 26.0–34.0)
MCHC: 33.5 g/dL (ref 30.0–36.0)
MCV: 84 fL (ref 80.0–100.0)
Platelets: 287 10*3/uL (ref 150–400)
RBC: 5.05 MIL/uL (ref 4.22–5.81)
RDW: 12.4 % (ref 11.5–15.5)
WBC: 8.5 10*3/uL (ref 4.0–10.5)
nRBC: 0 % (ref 0.0–0.2)

## 2022-08-15 LAB — COMPREHENSIVE METABOLIC PANEL
ALT: 21 U/L (ref 0–44)
AST: 19 U/L (ref 15–41)
Albumin: 4.1 g/dL (ref 3.5–5.0)
Alkaline Phosphatase: 97 U/L (ref 38–126)
Anion gap: 7 (ref 5–15)
BUN: 16 mg/dL (ref 6–20)
CO2: 24 mmol/L (ref 22–32)
Calcium: 9.2 mg/dL (ref 8.9–10.3)
Chloride: 107 mmol/L (ref 98–111)
Creatinine, Ser: 0.92 mg/dL (ref 0.61–1.24)
GFR, Estimated: >60 mL/min (ref >60)
Glucose, Bld: 105 mg/dL — ABNORMAL HIGH (ref 70–99)
Potassium: 3.9 mmol/L (ref 3.5–5.1)
Sodium: 138 mmol/L (ref 135–145)
Total Bilirubin: 0.7 mg/dL (ref 0.3–1.2)
Total Protein: 7.2 g/dL (ref 6.5–8.1)

## 2022-08-15 LAB — TSH: TSH: 1.594 u[IU]/mL (ref 0.350–4.500)

## 2022-08-15 LAB — T4, FREE: Free T4: 0.82 ng/dL (ref 0.61–1.12)

## 2022-08-15 LAB — TROPONIN I (HIGH SENSITIVITY)
Troponin I (High Sensitivity): <2 ng/L (ref <18)
Troponin I (High Sensitivity): <2 ng/L (ref <18)

## 2022-08-15 MED ORDER — METOCLOPRAMIDE HCL 5 MG/ML IJ SOLN
10.0000 mg | Freq: Once | INTRAMUSCULAR | Status: AC
  Administered 2022-08-15: 10 mg via INTRAVENOUS
  Filled 2022-08-15: qty 2

## 2022-08-15 MED ORDER — MECLIZINE HCL 50 MG PO TABS: 50.0000 mg | ORAL_TABLET | Freq: Three times a day (TID) | ORAL | 0 refills | Status: AC | PRN

## 2022-08-15 MED ORDER — MECLIZINE HCL 25 MG PO TABS
50.0000 mg | ORAL_TABLET | Freq: Once | ORAL | Status: AC
  Administered 2022-08-15: 50 mg via ORAL
  Filled 2022-08-15: qty 2

## 2022-08-15 MED ORDER — SODIUM CHLORIDE 0.9 % IV BOLUS
1000.0000 mL | Freq: Once | INTRAVENOUS | Status: AC
  Administered 2022-08-15: 1000 mL via INTRAVENOUS

## 2022-08-15 NOTE — ED Notes (Signed)
Blue top sent. 
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
## 2022-08-15 NOTE — ED Provider Notes (Signed)
[facility] Provider Note  Patient Contact: 7:08 PM (approximate)   History   Dizziness   HPI  [name] is a 31 y.o. male presents to the emergency department with headache that has occurred intermittently over the past month.  Patient endorses tension in his shoulders and the back of his neck.  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  He developed vertigo today that lasted for approximately 30 minutes.  No chest pain, chest tightness or shortness of breath.  No weakness in upper and lower extremities.      Physical Exam   Triage Vital Signs: ED Triage Vitals  Enc Vitals Group     BP 08/15/22 1719 125/84     Pulse Rate 08/15/22 1719 84     Resp 08/15/22 1719 16     Temp 08/15/22 1719 98.5 F (36.9 C)     Temp Source 08/15/22 1719 Oral     SpO2 08/15/22 1719 98 %     Weight 08/15/22 1720 224 lb (101.6 kg)     Height 08/15/22 1720 5\' 10"  (1.778 m)     Head Circumference --      Peak Flow --      Pain Score 08/15/22 1725 5     Pain Loc --      Pain Edu? --      Excl. in [place]? --     Most recent vital signs: Vitals:   08/15/22 1719 08/15/22 2000  BP: 125/84 117/67  Pulse: 84 73  Resp: 16 18  Temp: 98.5 F (36.9 C)   SpO2: 98% 100%     General: Alert and in no acute distress. Eyes:  PERRL. EOMI. Head: No acute traumatic findings ENT:      Nose: No congestion/rhinnorhea.      Mouth/Throat: Mucous membranes are moist. Neck: No stridor. No cervical spine tenderness to palpation. Cardiovascular:  Good peripheral perfusion Respiratory: Normal respiratory effort without tachypnea or retractions. Lungs CTAB. Good air entry to the bases with no decreased or absent breath sounds. Gastrointestinal: Bowel sounds 4 quadrants. Soft and nontender to palpation. No guarding or rigidity. No palpable masses. No distention. No CVA tenderness. Musculoskeletal: Full range of motion to all extremities.   Neurologic:  No gross focal neurologic deficits are appreciated.  Skin:   No rash noted    ED Results / Procedures / Treatments   Labs (all labs ordered are listed, but only abnormal results are displayed) Labs Reviewed  COMPREHENSIVE METABOLIC PANEL - Abnormal; Notable for the following components:      Result Value   Glucose, Bld 105 (*)    All other components within normal limits  CBC  TSH  T4, FREE  TROPONIN I (HIGH SENSITIVITY)  TROPONIN I (HIGH SENSITIVITY)       RADIOLOGY  I personally viewed and evaluated these images as part of my medical decision making, as well as reviewing the written report by the radiologist.  ED Provider Interpretation:   PROCEDURES:  Critical Care performed: No  Procedures   MEDICATIONS ORDERED IN ED: Medications  metoCLOPramide (REGLAN) injection 10 mg (10 mg Intravenous Given 08/15/22 2005)  meclizine (ANTIVERT) tablet 50 mg (50 mg Oral Given 08/15/22 2005)  sodium chloride 0.9 % bolus 1,000 mL (1,000 mLs Intravenous New Bag/Given 08/15/22 2013)     IMPRESSION / MDM / ASSESSMENT AND PLAN / ED COURSE  I reviewed the triage vital signs and the nursing  notes.                              Assessment and plan:   Headache 31 year old male presents to the emergency department with a headache that started gradually and developed in intensity slowly.  Vital signs are reassuring at triage.  On exam, patient was alert, active and nontoxic-appearing with no apparent neurodeficits.  CBC, CMP, troponin within range.  CT head unremarkable.  Patient reports that his dizziness and headache resolved after meclizine, Reglan and IV fluids.  Patient was discharged with meclizine.  Return precautions were given to return with new or worsening symptoms.   FINAL CLINICAL IMPRESSION(S) / ED DIAGNOSES   Final diagnoses:  Dizziness     Rx / DC Orders   ED Discharge Orders          Ordered    meclizine (ANTIVERT) 50 MG tablet  3 times  daily PRN        08/15/22 2156             Note:  This document was prepared using Dragon voice recognition software and may include unintentional dictation errors.   Pia Mau [place], PA-C 08/15/22 2203    Concha Se, MD 08/16/22 [phone]

## 2022-10-22 ENCOUNTER — Ambulatory Visit
Admission: EM | Admit: 2022-10-22 | Discharge: 2022-10-22 | Disposition: A | Discharge status: Home / Self Care | Payer: PRIVATE HEALTH INSURANCE

## 2022-10-22 ENCOUNTER — Encounter: Payer: Self-pay | Admitting: Emergency Medicine

## 2022-10-22 DIAGNOSIS — K047 Periapical abscess without sinus: Secondary | ICD-10-CM | POA: Diagnosis not present

## 2022-10-22 MED ORDER — AMOXICILLIN-POT CLAVULANATE 875-125 MG PO TABS: 1.0000 | ORAL_TABLET | Freq: Two times a day (BID) | ORAL | 0 refills | Status: AC

## 2022-10-22 NOTE — Discharge Instructions (Signed)
Please schedule a visit with a dentist ASAP.

## 2022-10-22 NOTE — ED Triage Notes (Signed)
Took 400 mg ibuprofen and 500 mg of tylenol this morning around 0700 am. Reports he was eating trail mix 1 week prior, felt a click, and has felt throbbing pain ever since. Also reports a hx of teeth grinding and poor dentition

## 2022-10-22 NOTE — ED Provider Notes (Signed)
[name]    CSN: [id number] Arrival date & time: 10/22/22  1115      History   Chief Complaint Chief Complaint  Patient presents with   Dental Pain    HPI [name] is a 32 y.o. male.    Dental Pain   Patient reports throbbing pain following eating trail mix about 1 week ago and after feeling a "click".  He endorses history of poor dentition and grinding teeth.  Treating symptoms with ibuprofen and Tylenol.  Past Medical History:  Diagnosis Date   ADHD    Anxiety    Asthma    Bipolar 1 disorder (HCC)    IBS (irritable bowel syndrome)    Overdose    PTSD (post-traumatic stress disorder)    Suicidal thoughts     Patient Active Problem List   Diagnosis Date Noted   Acute pain of right shoulder 10/26/2016   Substance induced mood disorder ([place]) 10/06/2016   Bipolar 1 disorder ([place]) 10/06/2016    Past Surgical History:  Procedure Laterality Date   APPENDECTOMY         Home Medications    Prior to Admission medications   Medication Sig Start Date End Date Taking? Authorizing Provider  busPIRone (BUSPAR) 7.5 MG tablet Take 7.5 mg by mouth 2 (two) times daily. 07/31/22  Yes [provider]  famotidine (PEPCID) 20 MG tablet Take 20 mg by mouth 2 (two) times daily. 10/07/22  Yes [provider]  hydrOXYzine (ATARAX/VISTARIL) 25 MG tablet Take 25 mg by mouth every 4 (four) hours as needed for anxiety or itching.   Yes [provider]  acetaminophen (TYLENOL) 500 MG tablet Take 500 mg by mouth every 6 (six) hours as needed for headache, fever, moderate pain or mild pain.    [provider]  FLOVENT HFA 110 MCG/ACT inhaler Inhale 2 puffs into the lungs in the morning and at bedtime. 11/24/20   [provider]  ibuprofen (ADVIL,MOTRIN) 800 MG tablet Take 800 mg by mouth 3 (three) times daily as needed. For pain.    [provider]  Multiple Vitamin (MULTIVITAMIN WITH MINERALS) TABS Take 1 tablet  by mouth daily.    [provider]  naproxen (NAPROSYN) 500 MG tablet Take 1 tablet (500 mg total) by mouth 2 (two) times daily with a meal. 04/16/16   Carrie Mew, MD  omeprazole (PRILOSEC) 20 MG capsule Take 20 mg by mouth daily.    [provider]  ranitidine (ZANTAC) 150 MG tablet Take 150 mg by mouth daily as needed. For acid reflux Patient not taking: Reported on 11/27/2020    [provider]    Family History Family History  Problem Relation Age of Onset   Depression Mother    Anxiety disorder Mother    Diabetes Father     Social History Social History   Tobacco Use   Smoking status: Former   Smokeless tobacco: Never   Tobacco comments:    vapes  Vaping Use   Vaping Use: Never used  Substance Use Topics   Alcohol use: No   Drug use: No     Allergies   Geodon [ziprasidone hydrochloride], Haldol [haloperidol lactate], Risperidone, and Ziprasidone   Review of Systems Review of Systems   Physical Exam Triage Vital Signs ED Triage Vitals  Enc Vitals Group     BP 10/22/22 1134 126/87     Pulse Rate 10/22/22 1134 89     Resp 10/22/22 1134 16  Temp 10/22/22 1134 98.4 F (36.9 C)     Temp Source 10/22/22 1134 Oral     SpO2 10/22/22 1134 98 %     Weight --      Height --      Head Circumference --      Peak Flow --      Pain Score 10/22/22 1136 7     Pain Loc --      Pain Edu? --      Excl. in [place]? --    No data found.  Updated Vital Signs BP 126/87 (BP Location: Left Arm)   Pulse 89   Temp 98.4 F (36.9 C) (Oral)   Resp 16   SpO2 98%   Visual Acuity Right Eye Distance:   Left Eye Distance:   Bilateral Distance:    Right Eye Near:   Left Eye Near:    Bilateral Near:     Physical Exam HENT:     Mouth/Throat:     Mouth: Mucous membranes are moist.     Dentition: Abnormal dentition. Dental tenderness and dental caries present.     Comments: Poor dentition.  Chipped or eroded dentin at the right side upper  canine.  Chipped or eroded dentin at the right upper second molar at the gumline.  He has exquisite tenderness at both of these teeth. Skin:    General: Skin is warm and dry.  Neurological:     General: No focal deficit present.     Mental Status: He is oriented to person, place, and time.  Psychiatric:        Mood and Affect: Mood normal.        Behavior: Behavior normal.      UC Treatments / Results  Labs (all labs ordered are listed, but only abnormal results are displayed) Labs Reviewed - No data to display  EKG   Radiology No results found.  Procedures Procedures (including critical care time)  Medications Ordered in UC Medications - No data to display  Initial Impression / Assessment and Plan / UC Course  I have reviewed the triage vital signs and the nursing notes.  Pertinent labs & imaging results that were available during my care of the patient were reviewed by me and considered in my medical decision making (see chart for details).   Poor dentition.  Chipped or eroded dentin at the right side upper canine.  Chipped or eroded dentin at the right upper second molar at the gumline.  He has exquisite tenderness at both of these teeth.  Will prescribe antibiotic therapy but warned patient that this would only give him temporary relief from his symptoms.  Will continue to use NSAIDs.  Patient uses famotidine twice daily for history of GERD (he endorses intolerance of omeprazole).  Suggested twice daily naproxen until his symptoms improve.  Strongly encouraged him to schedule a visit with a dentist ASAP.   Final Clinical Impressions(s) / UC Diagnoses   Final diagnoses:  None   Discharge Instructions   None    ED Prescriptions   None    PDMP not reviewed this encounter.   Rose Phi, [place] 10/22/22 1159

## 2022-12-07 ENCOUNTER — Ambulatory Visit: Payer: PRIVATE HEALTH INSURANCE | Admitting: Cardiology

## 2023-01-06 ENCOUNTER — Ambulatory Visit: Payer: PRIVATE HEALTH INSURANCE | Attending: Cardiology | Admitting: Cardiology

## 2023-01-09 ENCOUNTER — Encounter: Payer: Self-pay | Admitting: Cardiology

## 2023-03-20 ENCOUNTER — Encounter: Payer: Self-pay | Admitting: Emergency Medicine

## 2023-03-20 ENCOUNTER — Emergency Department: Payer: Self-pay

## 2023-03-20 ENCOUNTER — Emergency Department
Admission: EM | Admit: 2023-03-20 | Discharge: 2023-03-20 | Disposition: A | Discharge status: Home / Self Care | Payer: Self-pay | Attending: Emergency Medicine | Admitting: Emergency Medicine

## 2023-03-20 ENCOUNTER — Other Ambulatory Visit: Payer: Self-pay

## 2023-03-20 DIAGNOSIS — M5416 Radiculopathy, lumbar region: Secondary | ICD-10-CM

## 2023-03-20 MED ORDER — CYCLOBENZAPRINE HCL 5 MG PO TABS: 5.0000 mg | ORAL_TABLET | Freq: Three times a day (TID) | ORAL | 0 refills | Status: DC | PRN

## 2023-03-20 MED ORDER — PREDNISONE 10 MG PO TABS: 10.0000 mg | ORAL_TABLET | Freq: Every day | ORAL | 0 refills | Status: DC
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
## 2023-03-20 NOTE — Discharge Instructions (Addendum)
Please take prednisone as prescribed as well as muscle relaxer.  Call primary care provider or orthopedic office to schedule follow-up appointment 1 week if no improvement.  Return to the ER for any worsening symptoms or any urgent changes in your health such as fevers, swelling, pain, weakness

## 2023-03-20 NOTE — ED Triage Notes (Signed)
Pt here with left leg pain. Pt states he feels the pain behind his thigh traveling down his leg. Pt states his left leg feels warmer compared to his right. Pt ambulatory to triage.

## 2023-04-16 ENCOUNTER — Other Ambulatory Visit: Payer: Self-pay

## 2023-04-16 ENCOUNTER — Emergency Department
Admission: EM | Admit: 2023-04-16 | Discharge: 2023-04-16 | Disposition: A | Discharge status: Home / Self Care | Payer: Self-pay | Attending: Emergency Medicine | Admitting: Emergency Medicine

## 2023-04-16 DIAGNOSIS — M26621 Arthralgia of right temporomandibular joint: Secondary | ICD-10-CM | POA: Insufficient documentation

## 2023-04-16 MED ORDER — ACETAMINOPHEN 500 MG PO TABS
1000.0000 mg | ORAL_TABLET | Freq: Once | ORAL | Status: AC
  Administered 2023-04-16: 1000 mg via ORAL
  Filled 2023-04-16: qty 2

## 2023-04-16 MED ORDER — KETOROLAC TROMETHAMINE 30 MG/ML IJ SOLN
30.0000 mg | Freq: Once | INTRAMUSCULAR | Status: AC
  Administered 2023-04-16: 30 mg via INTRAMUSCULAR
  Filled 2023-04-16: qty 1

## 2023-04-16 MED ORDER — DEXAMETHASONE SODIUM PHOSPHATE 10 MG/ML IJ SOLN
10.0000 mg | Freq: Once | INTRAMUSCULAR | Status: AC
  Administered 2023-04-16: 10 mg via INTRAMUSCULAR
  Filled 2023-04-16: qty 1

## 2023-04-16 MED ORDER — KETOROLAC TROMETHAMINE 10 MG PO TABS: 10.0000 mg | ORAL_TABLET | Freq: Four times a day (QID) | ORAL | 0 refills | Status: DC | PRN

## 2023-04-16 MED ORDER — PREDNISONE 50 MG PO TABS: 50.0000 mg | ORAL_TABLET | Freq: Every day | ORAL | 0 refills | Status: DC

## 2023-04-16 NOTE — ED Notes (Signed)
Pt A&O x4, no obvious distress noted, respirations regular/unlabored. Pt verbalizes understanding of discharge instructions. Pt able to ambulate from ED independently.   

## 2023-04-16 NOTE — ED Triage Notes (Signed)
Pt states he has had dental pain x1week on the top right side. Pt states he had a tooth pulled x22month ago on same side.

## 2023-04-16 NOTE — ED Provider Notes (Signed)
[facility] Provider Note  Patient Contact: 9:20 PM (approximate)   History   Dental Pain  t HPI  [name] is a 32 y.o. male who presents the emergency department complaining of pain in his right TMJ region.  Patient states that he grinds his teeth that is night, he also has bad dentition.  He is unsure whether this may be coming off of his teeth or his jaw.  Patient denies any trauma to his face or mandible.  No chest pain, shortness of breath.  Patient has tried anti-inflammatories with limited relief.     Physical Exam   Triage Vital Signs: ED Triage Vitals  Encounter Vitals Group     BP 04/16/23 2021 (!) 148/100     Systolic BP Percentile --      Diastolic BP Percentile --      Pulse Rate 04/16/23 2021 72     Resp 04/16/23 2021 16     Temp 04/16/23 2021 98.3 F (36.8 C)     Temp Source 04/16/23 2021 Oral     SpO2 04/16/23 2021 100 %     Weight 04/16/23 2020 245 lb (111.1 kg)     Height 04/16/23 2020 5\' 11"  (1.803 m)     Head Circumference --      Peak Flow --      Pain Score 04/16/23 2020 8     Pain Loc --      Pain Education --      Exclude from Growth Chart --     Most recent vital signs: Vitals:   04/16/23 2021  BP: (!) 148/100  Pulse: 72  Resp: 16  Temp: 98.3 F (36.8 C)  SpO2: 100%     General: Alert and in no acute distress. ENT:      Ears:       Nose: No congestion/rhinnorhea.      Mouth/Throat: Mucous membranes are moist.  Poor dentition but no evidence of dental infection.  Patient is tender over the TMJ specifically on the right side. Neck: No stridor. No cervical spine tenderness to palpation.  Cardiovascular:  Good peripheral perfusion Respiratory: Normal respiratory effort without tachypnea or retractions. Lungs CTAB. Musculoskeletal: Full range of motion to all extremities.  Neurologic:  No gross focal neurologic deficits are appreciated.  Skin:   No rash noted Other:   ED Results / Procedures /  Treatments   Labs (all labs ordered are listed, but only abnormal results are displayed) Labs Reviewed - No data to display   EKG     RADIOLOGY   No results found.  PROCEDURES:  Critical Care performed:   Procedures   MEDICATIONS ORDERED IN ED: Medications  ketorolac (TORADOL) 30 MG/ML injection 30 mg (has no administration in time range)  dexamethasone (DECADRON) injection 10 mg (has no administration in time range)     IMPRESSION / MDM / ASSESSMENT AND PLAN / ED COURSE  I reviewed the triage vital signs and the nursing notes.                                 Differential diagnosis includes, but is not limited to, dental infection, dental trauma, TMJ, temporal arteritis   Patient's presentation is most consistent with acute presentation with potential threat to life or bodily function.   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  Patient presents emergency department with pain in his right  mandible.  Findings are consistent with tenderness over the TMJ.  No other significant findings.  He reports that he does grind his teeth in his sleep.  Will try anti-inflammatories.  Follow-up primary care dentist as needed..  Patient is given ED precautions to return to the ED for any worsening or new symptoms.     FINAL CLINICAL IMPRESSION(S) / ED DIAGNOSES   Final diagnoses:  Arthralgia of right temporomandibular joint     Rx / DC Orders   ED Discharge Orders          Ordered    predniSONE (DELTASONE) 50 MG tablet  Daily with breakfast        04/16/23 2208    ketorolac (TORADOL) 10 MG tablet  Every 6 hours PRN        04/16/23 2208             Note:  This document was prepared using Dragon voice recognition software and may include unintentional dictation errors.   Lanette Hampshire 04/16/23 2208    Jene Every, MD 04/19/23 [phone]

## 2023-05-09 ENCOUNTER — Other Ambulatory Visit: Payer: Self-pay

## 2023-05-09 MED ORDER — BUSPIRONE HCL 15 MG PO TABS
15.0000 mg | ORAL_TABLET | Freq: Three times a day (TID) | ORAL | 1 refills | Status: AC
  Filled 2023-05-09 (×2): qty 90, 30d supply, fill #0

## 2023-05-09 MED ORDER — FLUOXETINE HCL 20 MG PO CAPS
20.0000 mg | ORAL_CAPSULE | Freq: Every morning | ORAL | 1 refills | Status: AC
  Filled 2023-05-09 (×2): qty 30, 30d supply, fill #0

## 2023-05-14 ENCOUNTER — Emergency Department
Admission: EM | Admit: 2023-05-14 | Discharge: 2023-05-14 | Disposition: A | Discharge status: Home / Self Care | Payer: Medicaid Other | Attending: Emergency Medicine | Admitting: Emergency Medicine

## 2023-05-14 ENCOUNTER — Other Ambulatory Visit: Payer: Self-pay

## 2023-05-14 DIAGNOSIS — R197 Diarrhea, unspecified: Secondary | ICD-10-CM | POA: Diagnosis present

## 2023-05-14 DIAGNOSIS — R1084 Generalized abdominal pain: Secondary | ICD-10-CM | POA: Insufficient documentation

## 2023-05-14 LAB — CBC
HCT: 41.4 % (ref 39.0–52.0)
Hemoglobin: 14.2 g/dL (ref 13.0–17.0)
MCH: 28.8 pg (ref 26.0–34.0)
MCHC: 34.3 g/dL (ref 30.0–36.0)
MCV: 84 fL (ref 80.0–100.0)
Platelets: 336 10*3/uL (ref 150–400)
RBC: 4.93 MIL/uL (ref 4.22–5.81)
RDW: 12.7 % (ref 11.5–15.5)
WBC: 9.1 10*3/uL (ref 4.0–10.5)
nRBC: 0 % (ref 0.0–0.2)

## 2023-05-14 LAB — URINALYSIS, ROUTINE W REFLEX MICROSCOPIC
Bilirubin Urine: NEGATIVE
Glucose, UA: NEGATIVE mg/dL
Hgb urine dipstick: NEGATIVE
Ketones, ur: NEGATIVE mg/dL
Leukocytes,Ua: NEGATIVE
Nitrite: NEGATIVE
Protein, ur: NEGATIVE mg/dL
Specific Gravity, Urine: 1.003 — ABNORMAL LOW (ref 1.005–1.030)
pH: 6 (ref 5.0–8.0)

## 2023-05-14 LAB — COMPREHENSIVE METABOLIC PANEL
ALT: 29 U/L (ref 0–44)
AST: 21 U/L (ref 15–41)
Albumin: 4.4 g/dL (ref 3.5–5.0)
Alkaline Phosphatase: 87 U/L (ref 38–126)
Anion gap: 11 (ref 5–15)
BUN: 13 mg/dL (ref 6–20)
CO2: 23 mmol/L (ref 22–32)
Calcium: 8.9 mg/dL (ref 8.9–10.3)
Chloride: 102 mmol/L (ref 98–111)
Creatinine, Ser: 0.84 mg/dL (ref 0.61–1.24)
GFR, Estimated: >60 mL/min (ref >60)
Glucose, Bld: 148 mg/dL — ABNORMAL HIGH (ref 70–99)
Potassium: 3.7 mmol/L (ref 3.5–5.1)
Sodium: 136 mmol/L (ref 135–145)
Total Bilirubin: 0.8 mg/dL (ref 0.3–1.2)
Total Protein: 7.1 g/dL (ref 6.5–8.1)

## 2023-05-14 LAB — TROPONIN I (HIGH SENSITIVITY): Troponin I (High Sensitivity): <2 ng/L (ref <18)

## 2023-05-14 LAB — LIPASE, BLOOD: Lipase: 37 U/L (ref 11–51)

## 2023-05-14 MED ORDER — FAMOTIDINE 20 MG PO TABS
20.0000 mg | ORAL_TABLET | Freq: Once | ORAL | Status: AC
  Administered 2023-05-14: 20 mg via ORAL
  Filled 2023-05-14: qty 1

## 2023-05-14 MED ORDER — OMEPRAZOLE 20 MG PO CPDR: 20.0000 mg | DELAYED_RELEASE_CAPSULE | Freq: Every day | ORAL | 2 refills | Status: AC

## 2023-05-14 MED ORDER — ONDANSETRON 4 MG PO TBDP: 4.0000 mg | ORAL_TABLET | Freq: Three times a day (TID) | ORAL | 0 refills | Status: AC | PRN

## 2023-05-14 NOTE — ED Notes (Signed)
See triage notes. Patient c/o diarrhea and passing bloody stool for a week. Patient stated that his bowel movement started out as black and is now bright red.

## 2023-05-14 NOTE — ED Provider Notes (Signed)
[facility] Provider Note    Event Date/Time   First MD Initiated Contact with Patient 05/14/23 1524     (approximate)   History   Abdominal Pain   HPI  [name] is a 32 y.o. male presents to the emergency department with diarrhea.  Endorses multiple episodes of black stool over the past couple of days.  Upper abdominal pain.  Recently on antibiotics for dental issue.  Does endorse taking recent NSAIDs secondary to dental pain.  Denies any watery stool.  No history of C. difficile.  No recent travel.  Denies any nausea or vomiting.  No active abdominal pain at this time.     Physical Exam   Triage Vital Signs: ED Triage Vitals  Encounter Vitals Group     BP 05/14/23 1459 (!) 141/84     Systolic BP Percentile --      Diastolic BP Percentile --      Pulse Rate 05/14/23 1459 91     Resp 05/14/23 1459 17     Temp 05/14/23 1459 98 F (36.7 C)     Temp src --      SpO2 05/14/23 1459 100 %     Weight --      Height --      Head Circumference --      Peak Flow --      Pain Score 05/14/23 1458 5     Pain Loc --      Pain Education --      Exclude from Growth Chart --     Most recent vital signs: Vitals:   05/14/23 1459  BP: (!) 141/84  Pulse: 91  Resp: 17  Temp: 98 F (36.7 C)  SpO2: 100%    Physical Exam Constitutional:      Appearance: He is well-developed.  HENT:     Head: Atraumatic.  Eyes:     Conjunctiva/sclera: Conjunctivae normal.  Cardiovascular:     Rate and Rhythm: Regular rhythm.  Pulmonary:     Effort: No respiratory distress.  Abdominal:     Tenderness: There is abdominal tenderness in the epigastric area.  Musculoskeletal:     Cervical back: Normal range of motion.  Skin:    General: Skin is warm.     Capillary Refill: Capillary refill takes less than 2 seconds.  Neurological:     Mental Status: He is alert. Mental status is at baseline.     IMPRESSION / MDM / ASSESSMENT AND PLAN / ED COURSE  I  reviewed the triage vital signs and the nursing notes.  Differential diagnosis including C. difficile, bacterial diarrhea, peptic ulcer disease/upper GI bleed, ACS, IBS, malignancy  EKG  I, Corena Herter, the attending physician, personally viewed and interpreted this ECG.   Rate: Normal  Rhythm: Normal sinus  Axis: Normal  Intervals: Normal  ST&T Change: None  No tachycardic or bradycardic dysrhythmias while on cardiac telemetry.   LABS (all labs ordered are listed, but only abnormal results are displayed) Labs interpreted as -    Labs Reviewed  COMPREHENSIVE METABOLIC PANEL - Abnormal; Notable for the following components:      Result Value   Glucose, Bld 148 (*)    All other components within normal limits  URINALYSIS, ROUTINE W REFLEX MICROSCOPIC - Abnormal; Notable for the following components:   Color, Urine COLORLESS (*)    APPearance CLEAR (*)    Specific Gravity, Urine 1.003 (*)    All other components  within normal limits  GASTROINTESTINAL PANEL BY PCR, STOOL (REPLACES STOOL CULTURE)  C DIFFICILE QUICK SCREEN W PCR REFLEX    LIPASE, BLOOD  CBC  TROPONIN I (HIGH SENSITIVITY)     MDM  Patient was given PPI given concern for possible upper GI bleed.  Hemoglobin is stable.  Troponin undetectable have a low suspicion for ACS.  Low risk heart score do not feel that serial troponins are necessary.  Unable to provide a stool sample, have low suspicion for C. difficile given no watery diarrhea.  Discussed staying hydrated and starting on a PPI.  Discussed no NSAIDs.  Given return precautions for any worsening symptoms.  Discussed follow-up with primary care provider and GI.   PROCEDURES:  Critical Care performed: No  Procedures  Patient's presentation is most consistent with acute presentation with potential threat to life or bodily function.   MEDICATIONS ORDERED IN ED: Medications  famotidine (PEPCID) tablet 20 mg (20 mg Oral Given 05/14/23 1550)    FINAL  CLINICAL IMPRESSION(S) / ED DIAGNOSES   Final diagnoses:  Generalized abdominal pain  Diarrhea, unspecified type     Rx / DC Orders   ED Discharge Orders          Ordered    omeprazole (PRILOSEC) 20 MG capsule  Daily        05/14/23 1545    Ambulatory Referral to Primary Care (Establish Care)        05/14/23 1559    ondansetron (ZOFRAN-ODT) 4 MG disintegrating tablet  Every 8 hours PRN        05/14/23 1559             Note:  This document was prepared using Dragon voice recognition software and may include unintentional dictation errors.   Corena Herter, MD 05/14/23 1958

## 2023-05-14 NOTE — Discharge Instructions (Signed)
You were seen in the emergency department for diarrhea and abdominal pain.  Concern that you could have a gastric ulcer.  Avoid any ibuprofen or any NSAIDs.  Only take Tylenol for pain control.  You were started on Prilosec, take this daily until you can follow-up with her primary care physician or GI doctor.  You are also given a prescription for nausea medicine.  Return to the emergency department for any signs of dehydration, worsening pain or feeling like you are going to pass out.  Thank you for choosing [place] for your health care, it was my pleasure to care for you today!  Corena Herter, MD

## 2023-05-14 NOTE — ED Triage Notes (Signed)
Pt comes with c/o belly pain, diarrhea and dark stools for a week now.

## 2023-05-19 ENCOUNTER — Encounter: Payer: Self-pay | Admitting: Emergency Medicine

## 2023-05-19 ENCOUNTER — Ambulatory Visit
Admission: EM | Admit: 2023-05-19 | Discharge: 2023-05-19 | Disposition: A | Discharge status: Home / Self Care | Payer: Medicaid Other | Attending: Family Medicine | Admitting: Family Medicine

## 2023-05-19 DIAGNOSIS — K625 Hemorrhage of anus and rectum: Secondary | ICD-10-CM

## 2023-05-19 MED ORDER — HYDROCORTISONE ACETATE 25 MG RE SUPP: 25.0000 mg | Freq: Two times a day (BID) | RECTAL | 0 refills | Status: DC

## 2023-05-19 MED ORDER — PHENYLEPHRINE IN HARD FAT 0.25 % RE SUPP: 1.0000 | Freq: Two times a day (BID) | RECTAL | 0 refills | Status: DC

## 2023-05-19 NOTE — ED Provider Notes (Signed)
[facility]    CSN: [id number] Arrival date & time: 05/19/23  1236      History   Chief Complaint Chief Complaint  Patient presents with   Rectal Bleeding    HPI [name] is a 32 y.o. male.   Rectal bleeding associated with loose stools.  Going on for last 1 to 2 weeks.  Does have diffuse abdominal pain as well.  Went to emergency room on 05/14/2023.  They did not do a rectal exam.  Patient was given PPIs to help with abdominal discomfort.  Patient has been using it.  Still has persistent loose stools and fresh blood per rectum.  Also causing mild incontinence.  No dizziness lightheadedness.  No history of anemia.  No history of hemorrhoids in the past.  Patient has been referred to GI.  Appointment with GI is 06/12/2023.  Still has pain while passing stool.  Denies any injury.   Rectal Bleeding   Past Medical History:  Diagnosis Date   ADHD    Anxiety    Asthma    Bipolar 1 disorder (HCC)    IBS (irritable bowel syndrome)    Overdose    PTSD (post-traumatic stress disorder)    Suicidal thoughts     Patient Active Problem List   Diagnosis Date Noted   Acute pain of right shoulder 10/26/2016   Substance induced mood disorder (HCC) 10/06/2016   Bipolar 1 disorder (HCC) 10/06/2016    Past Surgical History:  Procedure Laterality Date   APPENDECTOMY         Home Medications    Prior to Admission medications   Medication Sig Start Date End Date Taking? Authorizing Provider  hydrocortisone (ANUSOL-HC) 25 MG suppository Place 1 suppository (25 mg total) rectally 2 (two) times daily. 05/19/23  Yes Lura Em, MD  phenylephrine (,USE FOR PREPARATION-H,) 0.25 % suppository Place 1 suppository rectally 2 (two) times daily. 05/19/23  Yes Lura Em, MD  acetaminophen (TYLENOL) 500 MG tablet Take 500 mg by mouth every 6 (six) hours as needed for headache, fever, moderate pain or mild pain.    [provider]  busPIRone  (BUSPAR) 15 MG tablet Take 1 tablet (15 mg total) by mouth 3 (three) times daily. 05/09/23     busPIRone (BUSPAR) 7.5 MG tablet Take 7.5 mg by mouth 2 (two) times daily. 07/31/22   [provider]  cyclobenzaprine (FLEXERIL) 5 MG tablet Take 1-2 tablets (5-10 mg total) by mouth 3 (three) times daily as needed for muscle spasms. 03/20/23   Evon Slack, PA-C  famotidine (PEPCID) 20 MG tablet Take 20 mg by mouth 2 (two) times daily. 10/07/22   [provider]  FLOVENT HFA 110 MCG/ACT inhaler Inhale 2 puffs into the lungs in the morning and at bedtime. 11/24/20   [provider]  FLUoxetine (PROZAC) 20 MG capsule Take 1 capsule (20 mg total) by mouth every morning. 05/09/23     hydrOXYzine (ATARAX/VISTARIL) 25 MG tablet Take 25 mg by mouth every 4 (four) hours as needed for anxiety or itching.    [provider]  ibuprofen (ADVIL,MOTRIN) 800 MG tablet Take 800 mg by mouth 3 (three) times daily as needed. For pain.    [provider]  ketorolac (TORADOL) 10 MG tablet Take 1 tablet (10 mg total) by mouth every 6 (six) hours as needed. 04/16/23   Cuthriell, Delorise Royals, PA-C  Multiple Vitamin (MULTIVITAMIN WITH MINERALS) TABS Take 1 tablet by mouth daily.  [provider]  naproxen (NAPROSYN) 500 MG tablet Take 1 tablet (500 mg total) by mouth 2 (two) times daily with a meal. 04/16/16   Sharman Cheek, MD  omeprazole (PRILOSEC) 20 MG capsule Take 1 capsule (20 mg total) by mouth daily. 05/14/23 08/12/23  Corena Herter, MD  ondansetron (ZOFRAN-ODT) 4 MG disintegrating tablet Take 1 tablet (4 mg total) by mouth every 8 (eight) hours as needed for nausea or vomiting. 05/14/23   Corena Herter, MD  predniSONE (DELTASONE) 50 MG tablet Take 1 tablet (50 mg total) by mouth daily with breakfast. 04/16/23   Cuthriell, Delorise Royals, PA-C  ranitidine (ZANTAC) 150 MG tablet Take 150 mg by mouth daily as needed. For acid reflux Patient not taking: Reported on 11/27/2020     [provider]    Family History Family History  Problem Relation Age of Onset   Depression Mother    Anxiety disorder Mother    Diabetes Father     Social History Social History   Tobacco Use   Smoking status: Former   Smokeless tobacco: Never   Tobacco comments:    vapes  Vaping Use   Vaping status: Never Used  Substance Use Topics   Alcohol use: No   Drug use: No     Allergies   Geodon [ziprasidone hydrochloride], Haldol [haloperidol lactate], Risperidone, and Ziprasidone   Review of Systems Review of Systems  Gastrointestinal:  Positive for hematochezia.  Negative other than stated in HPI.   Physical Exam Triage Vital Signs ED Triage Vitals  Encounter Vitals Group     BP 05/19/23 1246 134/79     Systolic BP Percentile --      Diastolic BP Percentile --      Pulse Rate 05/19/23 1246 80     Resp 05/19/23 1246 15     Temp 05/19/23 1246 98.6 F (37 C)     Temp Source 05/19/23 1246 Oral     SpO2 05/19/23 1246 97 %     Weight 05/19/23 1245 244 lb 14.9 oz (111.1 kg)     Height 05/19/23 1245 5\' 11"  (1.803 m)     Head Circumference --      Peak Flow --      Pain Score 05/19/23 1244 3     Pain Loc --      Pain Education --      Exclude from Growth Chart --    No data found.  Updated Vital Signs BP 134/79 (BP Location: Right Arm)   Pulse 80   Temp 98.6 F (37 C) (Oral)   Resp 15   Ht 5\' 11"  (1.803 m)   Wt 111.1 kg   SpO2 97%   BMI 34.16 kg/m   Visual Acuity Right Eye Distance:   Left Eye Distance:   Bilateral Distance:    Right Eye Near:   Left Eye Near:    Bilateral Near:     Physical Exam Constitutional:      Appearance: Normal appearance. He is normal weight.  Genitourinary:    Prostate: Normal.     Rectum: Tenderness and internal hemorrhoid present. No mass, anal fissure or external hemorrhoid. Normal anal tone.  Neurological:     Mental Status: He is alert.      UC Treatments / Results  Labs (all labs ordered are  listed, but only abnormal results are displayed) Labs Reviewed - No data to display  EKG   Radiology No results found.  Procedures Procedures (including critical care  time)  Medications Ordered in UC Medications - No data to display  Initial Impression / Assessment and Plan / UC Course  I have reviewed the triage vital signs and the nursing notes.  Pertinent labs & imaging results that were available during my care of the patient were reviewed by me and considered in my medical decision making (see chart for details).      Final Clinical Impressions(s) / UC Diagnoses   Final diagnoses:  Rectal bleeding     Discharge Instructions      Suspecting internal hemorrhoids versus anal fissures.   Continue with your plan to see the GI doctor on October 21. Hot sitz bath 2-3 times a day recommended. High-fiber diet recommended. Aggressive oral hydration recommended. Go to emergency room if symptoms persist.    ED Prescriptions     Medication Sig Dispense Auth. Provider   hydrocortisone (ANUSOL-HC) 25 MG suppository Place 1 suppository (25 mg total) rectally 2 (two) times daily. 12 suppository Lura Em, MD   phenylephrine (,USE FOR PREPARATION-H,) 0.25 % suppository Place 1 suppository rectally 2 (two) times daily. 12 suppository Lura Em, MD      PDMP not reviewed this encounter.   Lura Em, MD 05/19/23 1331

## 2023-05-19 NOTE — Discharge Instructions (Addendum)
Suspecting internal hemorrhoids versus anal fissures.   Continue with your plan to see the GI doctor on October 21. Hot sitz bath 2-3 times a day recommended. High-fiber diet recommended. Aggressive oral hydration recommended. Go to emergency room if symptoms persist.

## 2023-05-19 NOTE — ED Triage Notes (Signed)
Patient states that he was seen in the ED on 05/14/23 for abdominal pain and rectal bleeding.  Patient reports ongoing rectal bleeding and rectal pain.  Patient reports pain is worse with bowel movement or when wiping.

## 2023-05-31 ENCOUNTER — Other Ambulatory Visit: Payer: Self-pay

## 2023-06-12 ENCOUNTER — Encounter: Payer: Self-pay | Admitting: Gastroenterology

## 2023-06-12 ENCOUNTER — Ambulatory Visit (INDEPENDENT_AMBULATORY_CARE_PROVIDER_SITE_OTHER): Payer: Medicaid Other | Admitting: Gastroenterology

## 2023-06-12 VITALS — BP 118/79 | HR 73 | Temp 98.4°F | Ht 70.0 in | Wt 242.0 lb

## 2023-06-12 DIAGNOSIS — R197 Diarrhea, unspecified: Secondary | ICD-10-CM | POA: Diagnosis not present

## 2023-06-12 DIAGNOSIS — K219 Gastro-esophageal reflux disease without esophagitis: Secondary | ICD-10-CM

## 2023-06-12 DIAGNOSIS — Z8379 Family history of other diseases of the digestive system: Secondary | ICD-10-CM

## 2023-06-12 DIAGNOSIS — K625 Hemorrhage of anus and rectum: Secondary | ICD-10-CM | POA: Diagnosis not present

## 2023-06-12 DIAGNOSIS — Z83719 Family history of colon polyps, unspecified: Secondary | ICD-10-CM | POA: Diagnosis not present

## 2023-06-12 MED ORDER — NA SULFATE-K SULFATE-MG SULF 17.5-3.13-1.6 GM/177ML PO SOLN: 1.0000 | Freq: Once | ORAL | 0 refills | Status: AC

## 2023-06-12 NOTE — Progress Notes (Signed)
Gastroenterology Consultation  Referring Provider:     [name] [name], MD Primary Care Physician:  [name], MD Primary Gastroenterologist:  [name]     Reason for Consultation:     Rectal pain and rectal bleeding        HPI:   [name] is a 32 y.o. y/o male referred for consultation & management of rectal pain and rectal bleeding by Dr. [name], [name], MD. This patient comes in with a history of irritable bowel syndrome and multiple visits to the emergency department encompassing once a month since July.  This has been for a variety of issues including dental pain and 2 visits for rectal bleeding.  The patient was treated with a PPI and a GI consult was ordered.  The patient stated that the PPI was given for his abdominal discomfort and he reported that he was having persistent loose stools with bright red blood per rectum.  He also had reported that it was causing him mild incontinence.  He had denied any injury to his rectum. Father with adenomatous polyps and mother had polyps. He has had GERD since 32 years old. He has also had coffee ground vomiting and pain on left upper abdomen.   Past Medical History:  Diagnosis Date   ADHD    Anxiety    Asthma    Bipolar 1 disorder (HCC)    IBS (irritable bowel syndrome)    Overdose    PTSD (post-traumatic stress disorder)    Suicidal thoughts     Past Surgical History:  Procedure Laterality Date   APPENDECTOMY      Prior to Admission medications   Medication Sig Start Date End Date Taking? Authorizing Provider  acetaminophen (TYLENOL) 500 MG tablet Take 500 mg by mouth every 6 (six) hours as needed for headache, fever, moderate pain or mild pain.    [provider]  busPIRone (BUSPAR) 15 MG tablet Take 1 tablet (15 mg total) by mouth 3 (three) times daily. 05/09/23     busPIRone (BUSPAR) 7.5 MG tablet Take 7.5 mg by mouth 2 (two) times daily. 07/31/22   [provider]  cyclobenzaprine (FLEXERIL) 5  MG tablet Take 1-2 tablets (5-10 mg total) by mouth 3 (three) times daily as needed for muscle spasms. 03/20/23   Evon Slack, PA-C  famotidine (PEPCID) 20 MG tablet Take 20 mg by mouth 2 (two) times daily. 10/07/22   [provider]  FLOVENT HFA 110 MCG/ACT inhaler Inhale 2 puffs into the lungs in the morning and at bedtime. 11/24/20   [provider]  FLUoxetine (PROZAC) 20 MG capsule Take 1 capsule (20 mg total) by mouth every morning. 05/09/23     hydrocortisone (ANUSOL-HC) 25 MG suppository Place 1 suppository (25 mg total) rectally 2 (two) times daily. 05/19/23   Lura Em, MD  hydrOXYzine (ATARAX/VISTARIL) 25 MG tablet Take 25 mg by mouth every 4 (four) hours as needed for anxiety or itching.    [provider]  ibuprofen (ADVIL,MOTRIN) 800 MG tablet Take 800 mg by mouth 3 (three) times daily as needed. For pain.    [provider]  ketorolac (TORADOL) 10 MG tablet Take 1 tablet (10 mg total) by mouth every 6 (six) hours as needed. 04/16/23   Cuthriell, Delorise Royals, PA-C  Multiple Vitamin (MULTIVITAMIN WITH MINERALS) TABS Take 1 tablet by mouth daily.    [provider]  naproxen (NAPROSYN) 500 MG tablet Take 1 tablet (500 mg  total) by mouth 2 (two) times daily with a meal. 04/16/16   Sharman Cheek, MD  omeprazole (PRILOSEC) 20 MG capsule Take 1 capsule (20 mg total) by mouth daily. 05/14/23 08/12/23  Corena Herter, MD  ondansetron (ZOFRAN-ODT) 4 MG disintegrating tablet Take 1 tablet (4 mg total) by mouth every 8 (eight) hours as needed for nausea or vomiting. 05/14/23   Corena Herter, MD  phenylephrine (,USE FOR PREPARATION-H,) 0.25 % suppository Place 1 suppository rectally 2 (two) times daily. 05/19/23   Lura Em, MD  predniSONE (DELTASONE) 50 MG tablet Take 1 tablet (50 mg total) by mouth daily with breakfast. 04/16/23   Cuthriell, Delorise Royals, PA-C  ranitidine (ZANTAC) 150 MG tablet Take 150 mg by mouth daily as needed.  For acid reflux Patient not taking: Reported on 11/27/2020    [provider]    Family History  Problem Relation Age of Onset   Depression Mother    Anxiety disorder Mother    Diabetes Father      Social History   Tobacco Use   Smoking status: Former   Smokeless tobacco: Never   Tobacco comments:    vapes  Vaping Use   Vaping status: Never Used  Substance Use Topics   Alcohol use: No   Drug use: No    Allergies as of 06/12/2023 - Review Complete 05/19/2023  Allergen Reaction Noted   Geodon [ziprasidone hydrochloride] Other (See Comments) 09/02/2011   Haldol [haloperidol lactate] Other (See Comments) 01/07/2012   Risperidone  03/03/2015   Ziprasidone  09/02/2011    Review of Systems:    All systems reviewed and negative except where noted in HPI.   Physical Exam:  There were no vitals taken for this visit. No LMP for male patient. General:   Alert,  Well-developed, well-nourished, pleasant and cooperative in NAD Head:  Normocephalic and atraumatic. Eyes:  Sclera clear, no icterus.   Conjunctiva pink. Ears:  Normal auditory acuity. Neck:  Supple; no masses or thyromegaly. Lungs:  Respirations even and unlabored.  Clear throughout to auscultation.   No wheezes, crackles, or rhonchi. No acute distress. Heart:  Regular rate and rhythm; no murmurs, clicks, rubs, or gallops. Abdomen:  Normal bowel sounds.  No bruits.  Soft, non-tender and non-distended without masses, hepatosplenomegaly or hernias noted.  No guarding or rebound tenderness.  Negative Carnett sign.   Rectal:  Deferred.  Pulses:  Normal pulses noted. Extremities:  No clubbing or edema.  No cyanosis. Neurologic:  Alert and oriented x3;  grossly normal neurologically. Skin:  Intact without significant lesions or rashes.  No jaundice. Lymph Nodes:  No significant cervical adenopathy. Psych:  Alert and cooperative. Normal mood and affect.  Imaging Studies: No results found.  Assessment and Plan:    [name] is a 32 y.o. y/o male who comes in today with a history of heartburn since he is 32 years old without any dysphagia or unexplained weight loss.  The patient was on famotidine for some time and states that has not helped until he was started on omeprazole.  Patient has a family history with a mother with Barrett's and his mother and father have both had colon polyps before the age of 51.  The patient will be set up for an EGD and colonoscopy due to his chronic GERD and his rectal bleeding.  The patient has also been told that due to his loose stools he should take a fiber supplement to see if he can bulk them up more.  The patient has been explained the plan and agrees with it.    Midge Minium, MD. Clementeen Graham    Note: This dictation was prepared with Dragon dictation along with smaller phrase technology. Any transcriptional errors that result from this process are unintentional.

## 2023-06-12 NOTE — Addendum Note (Signed)
Addended by: Roena Malady on: 06/12/2023 02:59 PM   Modules accepted: Orders

## 2023-06-13 ENCOUNTER — Encounter: Payer: Self-pay | Admitting: Gastroenterology

## 2023-06-13 NOTE — Addendum Note (Signed)
Addended by: Roena Malady on: 06/13/2023 11:16 AM   Modules accepted: Orders

## 2023-06-14 ENCOUNTER — Ambulatory Visit (INDEPENDENT_AMBULATORY_CARE_PROVIDER_SITE_OTHER): Payer: Medicaid Other

## 2023-06-14 ENCOUNTER — Encounter: Payer: Self-pay | Admitting: Podiatry

## 2023-06-14 ENCOUNTER — Ambulatory Visit: Payer: Medicaid Other | Admitting: Podiatry

## 2023-06-14 VITALS — BP 121/69 | HR 74

## 2023-06-14 DIAGNOSIS — M76822 Posterior tibial tendinitis, left leg: Secondary | ICD-10-CM

## 2023-06-14 DIAGNOSIS — M25372 Other instability, left ankle: Secondary | ICD-10-CM

## 2023-06-14 DIAGNOSIS — M779 Enthesopathy, unspecified: Secondary | ICD-10-CM | POA: Diagnosis not present

## 2023-06-14 NOTE — Progress Notes (Signed)
  Subjective:  Patient ID: [name], male    DOB: December 04, 1990,  MRN: [id number]  Chief Complaint  Patient presents with   Foot Pain    "I have pain in this left foot and ankle.  It'll go numb if I work hard on it.  Then I get a sharp pain."    Discussed the use of AI scribe software for clinical note transcription with the patient, who gave verbal consent to proceed.  History of Present Illness   The patient presents with left foot pain and instability, which has been exacerbated by recent increased physical activity, including hiking. He describes the pain as general and located in the ankle and upper foot. He also reports intermittent numbness in the foot during fast walking or hiking. The patient has a history of a strained plantar fascia approximately eight years ago, for which he wore a boot for three months. He also reports occasional stiffness in the lower back. The patient is currently unemployed and has been focusing on physical activities such as hiking. He has noticed that his left ankle feels unstable, particularly when walking on uneven ground, and he has nearly rolled his ankle several times. The patient is currently awaiting an endoscopy and colonoscopy due to previous bleeding and has been advised to avoid ibuprofen.          Objective:    Physical Exam   MUSCULOSKELETAL: Warm and well-perfused left foot with palpable pulses and good capillary fill time. Slight tenderness to palpation on the posterior tibial tendon, distal to the malleolus. No pain over ATFL or CFL. Significant equinus contracture. Calf muscle tightness.       No images are attached to the encounter.    Results   RADIOLOGY Radiographs of the left foot and ankle: No acute osseous abnormalities. Well-maintained joint spaces. (06/14/2023)      Assessment:   1. Posterior tibial tendon dysfunction (PTTD) of left lower extremity   2. Ankle instability, left      Plan:  Patient was evaluated  and treated and all questions answered.  Assessment and Plan    Left Ankle Instability and Pain   He reports instability and intermittent numbness in his left ankle, especially during physical activities like hiking. Examination shows tenderness along the posterior tibial tendon and significant equinus contracture, though radiographs reveal no acute osseous abnormalities. We will recommend high top boots for hiking to provide additional support and advise the use of an ankle compression sleeve for stability. Exercises aimed at strengthening the ankle and improving stability should be performed twice daily for 6-8 weeks. If no improvement is observed, physical therapy will be considered. We will also check if Medicaid covers a lace-up ankle brace for extra support during physical activities.  Posterior Tibial Tendonitis   Tenderness along the posterior tibial tendon likely contributes to his reported ankle instability. We advise taking over-the-counter Motrin or Aleve for inflammation, pending clearance from a gastroenterologist following an upcoming endoscopy/colonoscopy. Prednisone may be considered if inflammation is severe, although he expresses reluctance due to previous side effects.  General Health Maintenance   He is currently between jobs and focusing on physical activity and weight loss. We encourage continued efforts towards weight loss, as this will likely improve joint pain and stability.          Return in about 6 weeks (around 07/26/2023), or if symptoms worsen or fail to improve.

## 2023-06-14 NOTE — Patient Instructions (Addendum)
VISIT SUMMARY:  Today, we discussed your left foot pain and instability, which has been troubling you, especially during physical activities like hiking. We also reviewed your general health and provided recommendations to help manage your symptoms and improve your overall well-being.  YOUR PLAN:  -LEFT ANKLE INSTABILITY AND PAIN: Left ankle instability means your ankle feels weak and unsteady, especially during activities like hiking. We recommend wearing high top boots for better support and using an ankle compression sleeve to help with stability. Perform ankle strengthening exercises twice daily for 6-8 weeks. If there's no improvement, we may consider physical therapy. We will also check if Medicaid covers a lace-up ankle brace for additional support.  -POSTERIOR TIBIAL TENDONITIS: Posterior tibial tendonitis is inflammation of the tendon that supports your arch and helps you walk. This can cause pain and instability in your ankle. We suggest taking over-the-counter Motrin or Aleve for inflammation, but only after getting clearance from your gastroenterologist following your endoscopy/colonoscopy. If the inflammation is severe, we might consider prednisone, although you have expressed concerns about its side effects.  -GENERAL HEALTH MAINTENANCE: You are currently focusing on physical activity and weight loss, which is excellent. Continued efforts towards weight loss will likely improve your joint pain and stability. Keep up the good work!  INSTRUCTIONS:  Please follow up with your gastroenterologist to get clearance for taking Motrin or Aleve. Continue with the recommended ankle exercises twice daily for 6-8 weeks. If you do not notice any improvement, we will consider physical therapy. We will also check if Medicaid covers a lace-up ankle brace for additional support during physical activities.  Chronic Ankle Instability & Rehab Chronic Ankle Instability Chronic ankle instability is a  condition that makes the ankle weak and more likely to give way. The condition is common among athletes, especially those with prior ankle ligament injury. Ligaments are strong tissues that connectbones to each other. What are the causes?  This condition is caused by multiple ankle sprains that have not healedproperly, leaving the ankle ligaments loose or damaged. What increases the risk? This condition is more likely to develop in people who participate in sports in which there is a risk of spraining an ankle. These sports include: Cross-country trail running. Basketball. Baseball. Tennis. Football. Soccer. What are the signs or symptoms? Symptoms of this condition include: Rolling your ankle repeatedly. Swelling. Pain. Bruising. Tenderness. Feeling wobbly or unsteady on your foot. Difficulty walking on uneven surfaces or in the dark. How is this diagnosed? This condition may be diagnosed based on: Your symptoms. Your medical history. A physical exam. Your health care provider will check your balance, strength, and range of motion. He or she will also check your injured ankle against your healthy ankle. Imaging tests, such as: An X-ray. A CT scan. An MRI. An ultrasound. How is this treated? Treatment for this condition may include: Wearing a removable boot, brace, or splint. Wearing supportive shoes or shoe inserts. Applying ice to the ankle to reduce swelling. Taking anti-inflammatory pain medicine. Doing exercises (physical therapy). Not putting any body weight, or putting only limited body weight, on your ankle for several days. Gradually returning to full activity. Surgery to repair damaged ligaments. Usually, surgery is needed only if the condition is severe or if othertreatments do not work. Follow these instructions at home: If you have a boot, brace, or splint: Wear it as told by your health care provider. Remove it only as told by your health care  provider. Loosen it if your toes  tingle, become numb, or turn cold and blue. Keep it clean. If it is not waterproof: Do not let it get wet. Cover it with a watertight covering when you take a bath or a shower. Ask your health care provider when it is safe to drive with a boot, brace, or splint on your foot. Managing pain, stiffness, and swelling  If directed, put ice on the injured area. If you have a removable boot, brace, or splint, remove it as told by your health care provider. Put ice in a plastic bag. Place a towel between your skin and the bag. Leave the ice on for 20 minutes, 2-3 times a day. Move your toes, foot, and ankle often to reduce stiffness and swelling. Raise (elevate) the injured area above the level of your heart while you are sitting or lying down.  Activity Return to your normal activities as told by your health care provider. Ask your health care provider what activities are safe for you. Do not put your full body weight on your ankle until your health care provider says that you can. Do not do any activities that make pain or swelling worse. Do exercises as told by your health care provider. General instructions Take over-the-counter and prescription medicines only as told by your health care provider. Wear supportive shoes or inserts as told by your health care provider. Keep all follow-up visits as told by your health care provider. This is important. How is this prevented? Wear supportive footwear that is appropriate for your athletic activity. Avoid athletic activities that cause pain or swelling in your ankle. See your health care provider if you have an ankle sprain that causes pain and swelling for more than 2-4 weeks. Do ankle range-of-motion and strengthening exercises as told by your health care provider before beginning any athletic activity. If you start a new athletic activity, start gradually to build up your strength and flexibility. Contact a  health care provider if: Your condition is not getting better after 2-4 weeks of treatment. You cannot put body weight on your ankle without feeling more pain. Summary Chronic ankle instability is a condition that makes the ankle weak and more likely to give way. This condition is caused by multiple ankle sprains that have not healed properly, leaving the ankle ligaments loose or damaged. Treatment includes wearing a boot, brace, or splint, taking medicines for pain and inflammation, and using ice on the affected area. Follow your health care provider's instructions for caring for your ankle during recovery. Contact your health care provider if your ankle does not get better in 2-4 weeks, or if you cannot put weight on your ankle without feeling more pain. This information is not intended to replace advice given to you by your health care provider. Make sure you discuss any questions you have with your healthcare provider. Document Revised: 05/22/2018 Document Reviewed: 05/22/2018 Elsevier Patient Education  2022 [employer].    Ask your health care provider which exercises are safe for you. Do exercises exactly as told by your health care provider and adjust them as directed. It is normal to feel mild stretching, pulling, tightness, or discomfort as you do these exercises. Stop right away if you feel sudden pain or your pain gets worse. Do not begin these exercises until told by your health care provider. Strengthening exercises These exercises build strength and endurance in your ankle. Endurance is theability to use your muscles for a long time, even after they get tired. Ankle eversion Sit  on the floor with your legs straight out in front of you. Loop a rubber exercise band around the ball of your left / right foot. The ball of your foot is on the walking surface, right under your toes. Hold the ends of the band in your hands, or secure the band to a stable object. Slowly push your foot  outward, away from your other leg (eversion). Hold this position for 10 seconds. Slowly return your foot to the starting position. Repeat 10 times. Complete this exercise 2 times a day. Heel walking  This exercise strengthens the muscles that push the ankle backward to point your toes toward your knee (ankle dorsiflexors). Walk on your heels for 10 ft. Keep your toes as high as possible. Repeat 10 times. Complete this exercise 2  times per day. Toe walking  This exercise strengthens the muscles that push the ankle forward and your toes downward (ankle plantar flexors). Walk on your toes for 10 ft. Keep your heels as high as possible. Repeat 10  times. Complete this exercise 2  times per day. Balance exercises These exercises improve or maintain your balance. Balance is important inimproving ankle stability and preventing falls. Tandem walking Do this exercise in a hallway or room that is at least 10 ft (3 m) long. Stand with one foot directly in front of the other (tandem). You can use the walls to help you balance if needed, but try not to use them for support. Slowly lift your back foot and place it directly in front of your other foot. Continue to walk in this heel-to-toe way for 10 ft. Repeat 10  times. Complete this exercise 2  times a day. Single leg stand Without shoes, stand near a railing or in a doorway. You may hold on to the railing or door frame as needed for balance. Stand on your left / right foot. Keep your big toe down on the floor and try to keep your arch lifted. If this is too easy, you can try doing it while you do one of these actions: Keep your eyes closed. Stand on a pillow. Throw a ball against a wall and catch it when it returns. Hold this position for 10 seconds. Repeat 10 times. Complete this exercise 2 times a day. Ankle inversion and ankle eversion This exercise is also called foot rotation with a balance board. It uses a balance board to rotate the foot  and ankle inward (inversion) and outward (eversion). Ask your health care provider where you can get a balance board or how you can make one. Stand on a non-carpeted surface near a countertop or wall. Step onto the balance board so your feet are hip width apart. Keep your feet in place, and keep your upper body and hips steady. Using only your feet and ankles to move the board, do the following exercises: Tip the board from side to side as far as you can, alternating between tipping to the left and tipping to the right. Tip the board so it silently taps the floor. Do not let the board forcefully hit the floor. From time to time, pause to hold a steady midway position, with neither the right nor the left sides touching the ground. Tip the board side to side so the board does not hit the floor at all. From time to time, pause to hold a steady midway position. Repeat 10 times, pausing from time to time to hold a steady position.Complete this exercise 2 times a  day. Ankle plantar flexion and ankle dorsiflexion This exercise is also called foot flexion with a balance board. It uses a balance board to push the foot downward and away from the leg (plantar flexion) or upward and toward the leg (dorsiflexion). Ask your health care provider where you can get a balance board or how you can make one. Stand on a non-carpeted surface near a countertop or wall. Step onto the balance board so your feet are hip width apart. Keep your feet in place, and keep your upper body and hips steady. Using only your feet and ankles, do the following exercises: Tip the board forward and backward so the board silently taps the floor. Do not let the board forcefully hit the floor. From time to time, pause to hold a steady position midway between touching the floor in front and touching the floor in back. Tip the board forward and backward so the board does not hit the floor at all. From time to time, pause to hold a steady  position. Repeat 10 times, pausing from time to time to hold a steady position.Complete this exercise 2 times a day. This information is not intended to replace advice given to you by your health care provider. Make sure you discuss any questions you have with your healthcare provider. Document Revised: 11/29/2018 Document Reviewed: 05/22/2018 Elsevier Patient Education  2022 [employer].

## 2023-06-20 ENCOUNTER — Ambulatory Visit: Payer: Medicaid Other | Admitting: General Practice

## 2023-06-20 ENCOUNTER — Encounter
Admission: RE | Disposition: A | Discharge status: Home / Self Care | Payer: Self-pay | Source: Home / Self Care | Attending: Gastroenterology

## 2023-06-20 ENCOUNTER — Other Ambulatory Visit: Payer: Self-pay

## 2023-06-20 ENCOUNTER — Ambulatory Visit
Admission: RE | Admit: 2023-06-20 | Discharge: 2023-06-20 | Disposition: A | Discharge status: Home / Self Care | Payer: Medicaid Other | Attending: Gastroenterology | Admitting: Gastroenterology

## 2023-06-20 ENCOUNTER — Encounter: Payer: Self-pay | Admitting: Gastroenterology

## 2023-06-20 DIAGNOSIS — F319 Bipolar disorder, unspecified: Secondary | ICD-10-CM | POA: Insufficient documentation

## 2023-06-20 DIAGNOSIS — K635 Polyp of colon: Secondary | ICD-10-CM | POA: Diagnosis not present

## 2023-06-20 DIAGNOSIS — K921 Melena: Secondary | ICD-10-CM | POA: Diagnosis present

## 2023-06-20 DIAGNOSIS — R634 Abnormal weight loss: Secondary | ICD-10-CM | POA: Diagnosis not present

## 2023-06-20 DIAGNOSIS — K625 Hemorrhage of anus and rectum: Secondary | ICD-10-CM

## 2023-06-20 DIAGNOSIS — Z87891 Personal history of nicotine dependence: Secondary | ICD-10-CM | POA: Insufficient documentation

## 2023-06-20 DIAGNOSIS — F419 Anxiety disorder, unspecified: Secondary | ICD-10-CM | POA: Insufficient documentation

## 2023-06-20 DIAGNOSIS — K449 Diaphragmatic hernia without obstruction or gangrene: Secondary | ICD-10-CM

## 2023-06-20 DIAGNOSIS — R12 Heartburn: Secondary | ICD-10-CM | POA: Insufficient documentation

## 2023-06-20 DIAGNOSIS — K640 First degree hemorrhoids: Secondary | ICD-10-CM

## 2023-06-20 DIAGNOSIS — K219 Gastro-esophageal reflux disease without esophagitis: Secondary | ICD-10-CM

## 2023-06-20 HISTORY — DX: Gastro-esophageal reflux disease without esophagitis: K21.9

## 2023-06-20 HISTORY — PX: COLONOSCOPY WITH PROPOFOL: SHX5780

## 2023-06-20 HISTORY — PX: ESOPHAGOGASTRODUODENOSCOPY (EGD) WITH PROPOFOL: SHX5813

## 2023-06-20 HISTORY — DX: Unspecified osteoarthritis, unspecified site: M19.90

## 2023-06-20 HISTORY — PX: POLYPECTOMY: SHX5525

## 2023-06-20 SURGERY — COLONOSCOPY WITH PROPOFOL
Anesthesia: General | Site: Rectum

## 2023-06-20 MED ORDER — DEXMEDETOMIDINE HCL IN NACL 200 MCG/50ML IV SOLN
INTRAVENOUS | Status: DC | PRN
  Administered 2023-06-20: 12 ug via INTRAVENOUS

## 2023-06-20 MED ORDER — MIDAZOLAM HCL 2 MG/2ML IJ SOLN
INTRAMUSCULAR | Status: AC
  Filled 2023-06-20: qty 2

## 2023-06-20 MED ORDER — DEXMEDETOMIDINE HCL IN NACL 80 MCG/20ML IV SOLN
INTRAVENOUS | Status: AC
  Filled 2023-06-20: qty 20

## 2023-06-20 MED ORDER — LIDOCAINE 2% (20 MG/ML) 5 ML SYRINGE
INTRAMUSCULAR | Status: DC | PRN
  Administered 2023-06-20: 100 mg via INTRAVENOUS

## 2023-06-20 MED ORDER — SODIUM CHLORIDE 0.9% FLUSH
INTRAVENOUS | Status: DC | PRN
  Administered 2023-06-20 (×2): 10 mL via INTRAVENOUS

## 2023-06-20 MED ORDER — SODIUM CHLORIDE 0.9% FLUSH: 10.0000 mL | INTRAVENOUS | Status: DC | PRN

## 2023-06-20 MED ORDER — SODIUM CHLORIDE 0.9 % IV SOLN: INTRAVENOUS | Status: DC

## 2023-06-20 MED ORDER — PROPOFOL 10 MG/ML IV BOLUS
INTRAVENOUS | Status: DC | PRN
  Administered 2023-06-20: 40 mg via INTRAVENOUS
  Administered 2023-06-20: 100 mg via INTRAVENOUS
  Administered 2023-06-20: 60 mg via INTRAVENOUS
  Administered 2023-06-20: 40 mg via INTRAVENOUS
  Administered 2023-06-20: 100 mg via INTRAVENOUS
  Administered 2023-06-20: 40 mg via INTRAVENOUS
  Administered 2023-06-20: 100 mg via INTRAVENOUS

## 2023-06-20 MED ORDER — STERILE WATER FOR IRRIGATION IR SOLN
Status: DC | PRN
  Administered 2023-06-20: 1

## 2023-06-20 MED ORDER — MIDAZOLAM HCL 2 MG/2ML IJ SOLN
2.0000 mg | Freq: Once | INTRAMUSCULAR | Status: AC
  Administered 2023-06-20: 2 mg via INTRAVENOUS

## 2023-06-20 MED ORDER — LIDOCAINE HCL (PF) 2 % IJ SOLN
INTRAMUSCULAR | Status: AC
  Filled 2023-06-20: qty 5

## 2023-06-20 SURGICAL SUPPLY — 8 items
BLOCK BITE 60FR ADLT L/F GRN (MISCELLANEOUS) ×3 IMPLANT
FORCEPS BIOP RAD 4 LRG CAP 4 (CUTTING FORCEPS) IMPLANT
GOWN CVR UNV OPN BCK APRN NK (MISCELLANEOUS) ×6 IMPLANT
GOWN ISOL THUMB LOOP REG UNIV (MISCELLANEOUS) ×6
KIT PRC NS LF DISP ENDO (KITS) ×3 IMPLANT
KIT PROCEDURE OLYMPUS (KITS) ×3
MANIFOLD NEPTUNE II (INSTRUMENTS) ×3 IMPLANT
WATER STERILE IRR 250ML POUR (IV SOLUTION) ×3 IMPLANT

## 2023-06-20 NOTE — Op Note (Signed)
[facility]enterology Patient Name: [name] Procedure Date: 06/20/2023 10:01 AM MRN: [id number] Account #: [id number] Date of Birth: Apr 20, 1991 Admit Type: Outpatient Age: 32 Room: [facility] OR ROOM 01 Gender: Male Note Status: Finalized Instrument Name: [id number] Procedure:             Colonoscopy Indications:           Hematochezia Providers:             Midge Minium MD, MD Referring MD:          [name] (Referring MD) Medicines:             Propofol per Anesthesia Complications:         No immediate complications. Procedure:             Pre-Anesthesia Assessment:                        - Prior to the procedure, a History and Physical was                         performed, and patient medications and allergies were                         reviewed. The patient's tolerance of previous                         anesthesia was also reviewed. The risks and benefits                         of the procedure and the sedation options and risks                         were discussed with the patient. All questions were                         answered, and informed consent was obtained. Prior                         Anticoagulants: The patient has taken no anticoagulant                         or antiplatelet agents. ASA Grade Assessment: II - A                         patient with mild systemic disease. After reviewing                         the risks and benefits, the patient was deemed in                         satisfactory condition to undergo the procedure.                        After obtaining informed consent, the colonoscope was                         passed under direct vision. Throughout the procedure,  the patient's blood pressure, pulse, and oxygen                         saturations were monitored continuously. The                         Colonoscope was introduced through the anus and                          advanced to the the terminal ileum. The colonoscopy                         was performed without difficulty. The patient                         tolerated the procedure well. The quality of the bowel                         preparation was excellent. Findings:      The perianal and digital rectal examinations were normal.      A 1 mm polyp was found in the descending colon. The polyp was sessile.       The polyp was removed with a cold biopsy forceps. Resection and       retrieval were complete.      Non-bleeding internal hemorrhoids were found during retroflexion. The       hemorrhoids were Grade I (internal hemorrhoids that do not prolapse). Impression:            - One 1 mm polyp in the descending colon, removed with                         a cold biopsy forceps. Resected and retrieved.                        - Non-bleeding internal hemorrhoids. Recommendation:        - Discharge patient to home.                        - High fiber diet.                        - Continue present medications.                        - Await pathology results. Procedure Code(s):     --- Professional ---                        [phone], Colonoscopy, flexible; with biopsy, single or                         multiple Diagnosis Code(s):     --- Professional ---                        K92.1, Melena (includes Hematochezia)                        D12.4, Benign neoplasm of descending colon CPT copyright 2022 American Medical Association. All rights reserved. The codes documented in this report are preliminary and  upon coder review may  be revised to meet current compliance requirements. Midge Minium MD, MD 06/20/2023 10:35:43 AM This report has been signed electronically. Number of Addenda: 0 Note Initiated On: 06/20/2023 10:01 AM Scope Withdrawal Time: 0 hours 7 minutes 14 seconds  Total Procedure Duration: 0 hours 10 minutes 4 seconds  Estimated Blood Loss:  Estimated blood loss: none.      [facility]

## 2023-06-20 NOTE — Op Note (Signed)
[facility] Gastroenterology Patient Name: [name] Procedure Date: 06/20/2023 10:02 AM MRN: [id number] Account #: [id number] Date of Birth: [birth year] Admit Type: Outpatient Age: 32 Room: [facility] OR ROOM 01 Gender: Male Note Status: Finalized Instrument Name: [id number] Procedure:             Upper GI endoscopy Indications:           Heartburn Providers:             Midge Minium MD, MD Referring MD:          [name] (Referring MD) Medicines:             Propofol per Anesthesia Complications:         No immediate complications. Procedure:             Pre-Anesthesia Assessment:                        - Prior to the procedure, a History and Physical was                         performed, and patient medications and allergies were                         reviewed. The patient's tolerance of previous                         anesthesia was also reviewed. The risks and benefits                         of the procedure and the sedation options and risks                         were discussed with the patient. All questions were                         answered, and informed consent was obtained. Prior                         Anticoagulants: The patient has taken no anticoagulant                         or antiplatelet agents. ASA Grade Assessment: II - A                         patient with mild systemic disease. After reviewing                         the risks and benefits, the patient was deemed in                         satisfactory condition to undergo the procedure.                        After obtaining informed consent, the endoscope was                         passed under direct vision. Throughout the procedure,  the patient's blood pressure, pulse, and oxygen                         saturations were monitored continuously. The Endoscope                         was introduced through the mouth, and advanced to the                          second part of duodenum. The upper GI endoscopy was                         accomplished without difficulty. The patient tolerated                         the procedure well. Findings:      A small hiatal hernia was present.      The stomach was normal.      The examined duodenum was normal. Impression:            - Small hiatal hernia.                        - Normal stomach.                        - Normal examined duodenum.                        - No specimens collected. Recommendation:        - Discharge patient to home.                        - Resume previous diet.                        - Continue present medications.                        - Perform a colonoscopy today. Procedure Code(s):     --- Professional ---                        [id number], Esophagogastroduodenoscopy, flexible,                         transoral; diagnostic, including collection of                         specimen(s) by brushing or washing, when performed                         (separate procedure) Diagnosis Code(s):     --- Professional ---                        R12, Heartburn CPT copyright 2022 American Medical Association. All rights reserved. The codes documented in this report are preliminary and upon coder review may  be revised to meet current compliance requirements. Midge Minium MD, MD 06/20/2023 10:21:42 AM This report has been signed electronically. Number of Addenda: 0 Note Initiated On: 06/20/2023 10:02 AM Scope Withdrawal Time: 0 hours 2 minutes 13 seconds  Total Procedure Duration: 0 hours 4 minutes 28 seconds  Estimated Blood Loss:  Estimated blood loss: none.      [facility]

## 2023-06-20 NOTE — Anesthesia Preprocedure Evaluation (Addendum)
Anesthesia Evaluation  Patient identified by MRN, date of birth, ID band Patient awake    Reviewed: Allergy & Precautions, H&P , NPO status , Patient's Chart, lab work & pertinent test results  Airway Mallampati: III  TM Distance: >3 FB Neck ROM: Full    Dental no notable dental hx.    Pulmonary asthma , former smoker   Pulmonary exam normal breath sounds clear to auscultation       Cardiovascular negative cardio ROS Normal cardiovascular exam Rhythm:Regular Rate:Normal     Neuro/Psych  PSYCHIATRIC DISORDERS Anxiety  Bipolar Disorder   Patient extremely anxious, panicky, requests IV versed, will give preop with pulse oximetry and oxygen per nasal cannula   GI/Hepatic Neg liver ROS,GERD  ,,  Endo/Other  negative endocrine ROS    Renal/GU negative Renal ROS  negative genitourinary   Musculoskeletal negative musculoskeletal ROS (+) Arthritis ,    Abdominal   Peds negative pediatric ROS (+)  Hematology negative hematology ROS (+)   Anesthesia Other Findings Medical History  Bipolar 1 disorder (HCC) Asthma Suicidal thoughts Overdose PTSD (post-traumatic stress disorder) Anxiety ADHD IBS (irritable bowel syndrome) Arthritis GERD (gastroesophageal reflux disease)  Patient extremely anxious, panicky received pre-procedure versed 2 mg IV with relief of symptoms of anxiety  Reproductive/Obstetrics negative OB ROS                             Anesthesia Physical Anesthesia Plan  ASA: 2  Anesthesia Plan: General   Post-op Pain Management:    Induction: Intravenous  PONV Risk Score and Plan:   Airway Management Planned: Natural Airway and Nasal Cannula  Additional Equipment:   Intra-op Plan:   Post-operative Plan:   Informed Consent: I have reviewed the patients History and Physical, chart, labs and discussed the procedure including the risks, benefits and alternatives for the  proposed anesthesia with the patient or authorized representative who has indicated his/her understanding and acceptance.     Dental Advisory Given  Plan Discussed with: Anesthesiologist, CRNA and Surgeon  Anesthesia Plan Comments: (Patient consented for risks of anesthesia including but not limited to:  - adverse reactions to medications - risk of airway placement if required - damage to eyes, teeth, lips or other oral mucosa - nerve damage due to positioning  - sore throat or hoarseness - Damage to heart, brain, nerves, lungs, other parts of body or loss of life  Patient voiced understanding and assent.)       Anesthesia Quick Evaluation

## 2023-06-20 NOTE — H&P (Signed)
Adam Minium, MD [facility] [address], [address] [place], [place] [address] Phone:[phone] Fax : [phone]  Primary Care Physician:  [name], MD Primary Gastroenterologist:  [name]  Pre-Procedure History & Physical: HPI:  [name] is a 32 y.o. male is here for an endoscopy and colonoscopy.   Past Medical History:  Diagnosis Date   ADHD    Anxiety    Arthritis    knees   Asthma    sports induced.  no recent issues   Bipolar 1 disorder (HCC)    GERD (gastroesophageal reflux disease)    IBS (irritable bowel syndrome)    Overdose    PTSD (post-traumatic stress disorder)    Suicidal thoughts     Past Surgical History:  Procedure Laterality Date   APPENDECTOMY      Prior to Admission medications   Medication Sig Start Date End Date Taking? Authorizing Provider  acetaminophen (TYLENOL) 500 MG tablet Take 500 mg by mouth every 6 (six) hours as needed for headache, fever, moderate pain or mild pain.   Yes [provider]  busPIRone (BUSPAR) 15 MG tablet Take 1 tablet (15 mg total) by mouth 3 (three) times daily. 05/09/23  Yes   FLUoxetine (PROZAC) 20 MG capsule Take 1 capsule (20 mg total) by mouth every morning. 05/09/23  Yes   hydrocortisone (ANUSOL-HC) 25 MG suppository Place 1 suppository (25 mg total) rectally 2 (two) times daily. 05/19/23  Yes Lura Em, MD  Multiple Vitamin (MULTIVITAMIN WITH MINERALS) TABS Take 1 tablet by mouth daily.   Yes [provider]  omeprazole (PRILOSEC) 20 MG capsule Take 1 capsule (20 mg total) by mouth daily. 05/14/23 08/12/23 Yes Mumma, Carollee Herter, MD  phenylephrine (,USE FOR PREPARATION-H,) 0.25 % suppository Place 1 suppository rectally 2 (two) times daily. 05/19/23  Yes Lura Em, MD  cyclobenzaprine (FLEXERIL) 5 MG tablet Take 1-2 tablets (5-10 mg total) by mouth 3 (three) times daily as needed for muscle spasms. Patient not taking: Reported on 06/13/2023 03/20/23   Evon Slack, PA-C  ondansetron (ZOFRAN-ODT) 4 MG disintegrating tablet Take 1 tablet (4 mg total) by mouth every 8 (eight) hours as needed for nausea or vomiting. Patient not taking: Reported on 06/13/2023 05/14/23   Corena Herter, MD    Allergies as of 06/12/2023 - Review Complete 06/12/2023  Allergen Reaction Noted   Geodon [ziprasidone hydrochloride] Other (See Comments) 09/02/2011   Haldol [haloperidol lactate] Other (See Comments) 01/07/2012   Risperidone  03/03/2015   Ziprasidone  09/02/2011    Family History  Problem Relation Age of Onset   Depression Mother    Anxiety disorder Mother    Diabetes Father     Social History   Socioeconomic History   Marital status: Single    Spouse name: Not on file   Number of children: Not on file   Years of education: Not on file   Highest education level: Not on file  Occupational History   Not on file  Tobacco Use   Smoking status: Former    Current packs/day: 0.00    Average packs/day: 1 pack/day for 3.0 years (3.0 ttl pk-yrs)    Types: Cigarettes    Start date: 11/21/2018    Quit date: 11/20/2021    Years since quitting: 1.5   Smokeless tobacco: Never   Tobacco comments:    vapes  Vaping Use   Vaping status: Never Used  Substance and Sexual Activity   Alcohol use: No   Drug  use: No   Sexual activity: Yes  Other Topics Concern   Not on file  Social History Narrative   Not on file   Social Determinants of Health   Financial Resource Strain: Not on file  Food Insecurity: Food Insecurity Present (04/03/2023)   Received from [facility]   Hunger Vital Sign    Worried About Running Out of Food in the Last Year: Often true    Ran Out of Food in the Last Year: Often true  Transportation Needs: No Transportation Needs (04/06/2023)   Received from [facility] - Transportation    Lack of Transportation (Medical): No    Lack of Transportation (Non-Medical): No  Physical Activity: Not on file  Stress: Stress Concern  Present (04/03/2023)   Received from [facility] - Occupational Stress Questionnaire    Feeling of Stress : To some extent  Social Connections: Unknown (04/03/2023)   Received from [facility]   Social Network    Social Network: Not on file  Intimate Partner Violence: Not At Risk (04/03/2023)   Received from [facility]   HITS    Over the last 12 months how often did your partner physically hurt you?: 1    Over the last 12 months how often did your partner insult you or talk down to you?: 1    Over the last 12 months how often did your partner threaten you with physical harm?: 1    Over the last 12 months how often did your partner scream or curse at you?: 1    Review of Systems: See HPI, otherwise negative ROS  Physical Exam: BP 124/85   Temp 98.2 F (36.8 C) (Temporal)   Resp 13   Ht 5\' 10"  (1.778 m)   Wt 105.3 kg   SpO2 100%   BMI 33.30 kg/m  General:   Alert,  pleasant and cooperative in NAD Head:  Normocephalic and atraumatic. Neck:  Supple; no masses or thyromegaly. Lungs:  Clear throughout to auscultation.    Heart:  Regular rate and rhythm. Abdomen:  Soft, nontender and nondistended. Normal bowel sounds, without guarding, and without rebound.   Neurologic:  Alert and  oriented x4;  grossly normal neurologically.  Impression/Plan: [name] is here for an endoscopy and colonoscopy to be performed for weight loss gerd and rectal bleeding  Risks, benefits, limitations, and alternatives regarding  endoscopy and colonoscopy have been reviewed with the patient.  Questions have been answered.  All parties agreeable.   Adam Minium, MD  06/20/2023, 9:19 AM

## 2023-06-20 NOTE — Transfer of Care (Signed)
Immediate Anesthesia Transfer of Care Note  Patient: [name]  Procedure(s) Performed: COLONOSCOPY WITH BIOPSY (Rectum) ESOPHAGOGASTRODUODENOSCOPY (EGD) WITH PROPOFOL (Mouth) POLYPECTOMY (Rectum)  Patient Location: PACU  Anesthesia Type:General  Level of Consciousness: sedated  Airway & Oxygen Therapy: Patient Spontanous Breathing  Post-op Assessment: Report given to RN and Post -op Vital signs reviewed and stable  Post vital signs: Reviewed and stable  Last Vitals:  Vitals Value Taken Time  BP    Temp    Pulse 77 06/20/23 1037  Resp    SpO2 97 % 06/20/23 1037  Vitals shown include unfiled device data.  Last Pain:  Vitals:   06/20/23 0910  TempSrc: Temporal  PainSc: 0-No pain         Complications: No notable events documented.

## 2023-06-20 NOTE — Anesthesia Postprocedure Evaluation (Signed)
Anesthesia Post Note  Patient: [name]  Procedure(s) Performed: COLONOSCOPY WITH BIOPSY (Rectum) ESOPHAGOGASTRODUODENOSCOPY (EGD) WITH PROPOFOL (Mouth) POLYPECTOMY (Rectum)  Patient location during evaluation: PACU Anesthesia Type: General Level of consciousness: awake and alert Pain management: pain level controlled Vital Signs Assessment: post-procedure vital signs reviewed and stable Respiratory status: spontaneous breathing, nonlabored ventilation, respiratory function stable and patient connected to nasal cannula oxygen Cardiovascular status: blood pressure returned to baseline and stable Postop Assessment: no apparent nausea or vomiting Anesthetic complications: no   No notable events documented.   Last Vitals:  Vitals:   06/20/23 1045 06/20/23 1100  BP: 94/69 112/76  Pulse: 78 80  Resp: 10 15  Temp:  37.1 C  SpO2: 96% 97%    Last Pain:  Vitals:   06/20/23 1100  TempSrc:   PainSc: 0-No pain                 Lisandro Meggett C Shaniya Tashiro

## 2023-06-21 ENCOUNTER — Encounter: Payer: Self-pay | Admitting: Gastroenterology

## 2023-06-21 LAB — SURGICAL PATHOLOGY

## 2023-06-22 ENCOUNTER — Encounter: Payer: Self-pay | Admitting: Gastroenterology

## 2023-07-26 ENCOUNTER — Ambulatory Visit: Payer: Medicaid Other | Admitting: Podiatry

## 2023-08-28 ENCOUNTER — Encounter: Payer: Self-pay | Admitting: Pulmonary Disease

## 2023-08-28 ENCOUNTER — Ambulatory Visit: Payer: MEDICAID | Admitting: Pulmonary Disease

## 2023-08-28 VITALS — BP 114/78 | HR 85 | Temp 97.6°F | Ht 70.0 in | Wt 239.0 lb

## 2023-08-28 DIAGNOSIS — J4540 Moderate persistent asthma, uncomplicated: Secondary | ICD-10-CM

## 2023-08-28 DIAGNOSIS — J4530 Mild persistent asthma, uncomplicated: Secondary | ICD-10-CM

## 2023-08-28 LAB — NITRIC OXIDE: Nitric Oxide: 18

## 2023-08-28 MED ORDER — BUDESONIDE-FORMOTEROL FUMARATE 160-4.5 MCG/ACT IN AERO: 2.0000 | INHALATION_SPRAY | Freq: Two times a day (BID) | RESPIRATORY_TRACT | 12 refills | Status: AC

## 2023-08-28 NOTE — Progress Notes (Signed)
 Synopsis: Referred in by Cecily Katz, PA-C   Subjective:   PATIENT ID: [name] GENDER: male DOB: 31-Dec-1990, MRN: [id number]  Chief Complaint  Patient presents with   Consult    History of asthma. Shortness of breath on exertion.     HPI [name] is a 33 year old male patient with a past medical history of exercise-induced asthma presenting today to the pulmonary office to establish care.  He was diagnosed with exercise-induced asthma in his teens but has not been of an issue and rarely used his rescue inhaler.  The last 3 or 4 months he had an episode where he was biking uphill and felt very short of breath and very dizzy which prompted an appointment with a primary care physician.  He was prescribed albuterol  rescue inhaler and referred to us  for further evaluation.  He says that his exercise-induced asthma has been worse recently.  Symptoms usually manifest as chest tightness and inability to catch his breath.  Some intermittent wheezing also noted.  Report heat and humidity flares up his symptoms.  He is also hypersensitive to strong scents.  He has never been intubated or hospitalized for asthma.  He reports atopic dermatitis but no allergic rhinitis.  Family history -grandpa passed away of lung cancer.  Sister with severe persistent asthma.  Social history -smoked for 4 years.  1 pack/day.  Quit in 2023.  Drinks alcohol only occasionally.  He denies any illicit drug use including marijuana.  He is currently unemployed worked as a naval architect in the past.  No pets at home.  ROS Labs and imaging were reviewed.  Objective:   Vitals:   08/28/23 1449  BP: 114/78  Pulse: 85  Temp: 97.6 F (36.4 C)  TempSrc: Temporal  SpO2: 98%  Weight: 239 lb (108.4 kg)  Height: 5' 10 (1.778 m)   98% on RA BMI Readings from Last 3 Encounters:  08/28/23 34.29 kg/m  06/20/23 33.30 kg/m  06/12/23 34.72 kg/m   Wt Readings from Last 3 Encounters:  08/28/23 239  lb (108.4 kg)  06/20/23 232 lb 1.6 oz (105.3 kg)  06/12/23 242 lb (109.8 kg)    Physical Exam GEN: NAD, Healthy Appearing HEENT: Supple Neck, Reactive Pupils, EOMI  CVS: Normal S1, Normal S2, RRR, No murmurs or ES appreciated  Lungs: Clear bilateral air entry.  Abdomen: Soft, non tender, non distended, + BS  Extremities: Warm and well perfused, No edema  Skin: No suspicious lesions appreciated  Psych: Normal Affect  Ancillary Information   CBC    Component Value Date/Time   WBC 9.1 05/14/2023 1459   RBC 4.93 05/14/2023 1459   HGB 14.2 05/14/2023 1459   HGB 15.6 01/31/2013 1444   HCT 41.4 05/14/2023 1459   HCT 45.3 01/31/2013 1444   PLT 336 05/14/2023 1459   PLT 261 01/31/2013 1444   MCV 84.0 05/14/2023 1459   MCV 83 01/31/2013 1444   MCH 28.8 05/14/2023 1459   MCHC 34.3 05/14/2023 1459   RDW 12.7 05/14/2023 1459   RDW 13.4 01/31/2013 1444   LYMPHSABS 2.8 11/27/2020 1128   MONOABS 0.5 11/27/2020 1128   EOSABS 0.1 11/27/2020 1128   BASOSABS 0.1 11/27/2020 1128   Labs and imaging were reviewed.      No data to display           Assessment & Plan:  [name] is a 33 year old male patient with a past medical history of exercise-induced asthma presenting today to the pulmonary  office to establish care.  #Moderate persistent asthma  #EIA  Feno 18   []  Start budesonide -formoterol  [Symbicort ] 160-4.52 puffs twice a day.  Advised to rinse mouth post use. []  Use albuterol  only as needed. []  PFTs  Return in about 3 months (around 11/26/2023) for with the PFTs .  I spent 60 minutes caring for this patient today, including preparing to see the patient, obtaining a medical history , reviewing a separately obtained history, performing a medically appropriate examination and/or evaluation, counseling and educating the patient/family/caregiver, ordering medications, tests, or procedures, documenting clinical information in the electronic health record, and independently  interpreting results (not separately reported/billed) and communicating results to the patient/family/caregiver  Darrin Barn, MD [facility] 08/28/2023 3:13 PM

## 2023-08-28 NOTE — Addendum Note (Signed)
 Addended by: Hyacinth Meeker on: 08/28/2023 03:21 PM   Modules accepted: Orders

## 2023-10-07 ENCOUNTER — Encounter: Payer: Self-pay | Admitting: Emergency Medicine

## 2023-10-07 ENCOUNTER — Other Ambulatory Visit: Payer: Self-pay

## 2023-10-07 ENCOUNTER — Ambulatory Visit
Admission: EM | Admit: 2023-10-07 | Discharge: 2023-10-07 | Disposition: A | Discharge status: Home / Self Care | Payer: MEDICAID | Attending: Internal Medicine | Admitting: Internal Medicine

## 2023-10-07 DIAGNOSIS — K047 Periapical abscess without sinus: Secondary | ICD-10-CM

## 2023-10-07 MED ORDER — PENICILLIN V POTASSIUM 500 MG PO TABS: ORAL_TABLET | ORAL | 0 refills | Status: DC

## 2023-10-07 NOTE — Discharge Instructions (Signed)
Check [facility] dental program to see if you could get a discount on dental care

## 2023-10-07 NOTE — ED Provider Notes (Signed)
[name]    CSN: [id number] Arrival date & time: 10/07/23  4098      History   Chief Complaint Chief Complaint  Patient presents with   Dental Pain    HPI [name] is a 33 y.o. male who presents with broken took on the R upper side which has been going on  for the past year and this area has recurrent abscesses since. He is saving up to see a dentist. Has not had a fever. He would also like to have his R ear checked. Sometimes feels like something is in it and tickles specially when he wars  ear buds.     Past Medical History:  Diagnosis Date   ADHD    Anxiety    Arthritis    knees   Asthma    sports induced.  no recent issues   Bipolar 1 disorder (HCC)    GERD (gastroesophageal reflux disease)    IBS (irritable bowel syndrome)    Overdose    PTSD (post-traumatic stress disorder)    Suicidal thoughts     Patient Active Problem List   Diagnosis Date Noted   Rectal bleeding 06/20/2023   Polyp of descending colon 06/20/2023   Gastroesophageal reflux disease 06/20/2023   Acute pain of right shoulder 10/26/2016   Substance induced mood disorder (HCC) 10/06/2016   Bipolar 1 disorder (HCC) 10/06/2016    Past Surgical History:  Procedure Laterality Date   APPENDECTOMY     COLONOSCOPY WITH PROPOFOL N/A 06/20/2023   Procedure: COLONOSCOPY WITH BIOPSY;  Surgeon: Midge Minium, MD;  Location: [facility];  Service: Endoscopy;  Laterality: N/A;   ESOPHAGOGASTRODUODENOSCOPY (EGD) WITH PROPOFOL N/A 06/20/2023   Procedure: ESOPHAGOGASTRODUODENOSCOPY (EGD) WITH PROPOFOL;  Surgeon: Midge Minium, MD;  Location: [facility];  Service: Endoscopy;  Laterality: N/A;   POLYPECTOMY  06/20/2023   Procedure: POLYPECTOMY;  Surgeon: Midge Minium, MD;  Location: [facility];  Service: Endoscopy;;       Home Medications    Prior to Admission medications   Medication Sig Start Date End Date Taking? Authorizing Provider  penicillin v  potassium (VEETID) 500 MG tablet 2 bid x 10 days 10/07/23  Yes Rodriguez-Southworth, Nettie Elm, PA-C  acetaminophen (TYLENOL) 500 MG tablet Take 500 mg by mouth every 6 (six) hours as needed for headache, fever, moderate pain or mild pain.    [provider]  budesonide-formoterol (SYMBICORT) 160-4.5 MCG/ACT inhaler Inhale 2 puffs into the lungs in the morning and at bedtime. 08/28/23   Assaker, [place], MD  busPIRone (BUSPAR) 15 MG tablet Take 1 tablet (15 mg total) by mouth 3 (three) times daily. 05/09/23     FLUoxetine (PROZAC) 20 MG capsule Take 1 capsule (20 mg total) by mouth every morning. 05/09/23     Multiple Vitamin (MULTIVITAMIN WITH MINERALS) TABS Take 1 tablet by mouth daily.    [provider]  omeprazole (PRILOSEC) 20 MG capsule Take 1 capsule (20 mg total) by mouth daily. 05/14/23 08/28/23  Corena Herter, MD  ondansetron (ZOFRAN-ODT) 4 MG disintegrating tablet Take 1 tablet (4 mg total) by mouth every 8 (eight) hours as needed for nausea or vomiting. 05/14/23   Mumma, Carollee Herter, MD  VENTOLIN HFA 108 (90 Base) MCG/ACT inhaler Inhale 2 puffs into the lungs every 4 (four) hours as needed. 08/15/23   [provider]    Family History Family History  Problem Relation Age of Onset   Depression Mother    Anxiety disorder Mother  Diabetes Father     Social History Social History   Tobacco Use   Smoking status: Former    Current packs/day: 0.00    Average packs/day: 1 pack/day for 3.0 years (3.0 ttl pk-yrs)    Types: Cigarettes    Start date: 11/21/2018    Quit date: 11/20/2021    Years since quitting: 1.8   Smokeless tobacco: Never   Tobacco comments:    vapes  Vaping Use   Vaping status: Never Used  Substance Use Topics   Alcohol use: No   Drug use: No     Allergies   Geodon [ziprasidone hydrochloride], Haldol [haloperidol lactate], Hydroxyzine, Minipress [prazosin], Risperidone, Trazodone and nefazodone, and Ziprasidone   Review of  Systems Review of Systems As noted in HPI  Physical Exam Triage Vital Signs ED Triage Vitals  Encounter Vitals Group     BP 10/07/23 0834 (!) 137/91     Systolic BP Percentile --      Diastolic BP Percentile --      Pulse Rate 10/07/23 0834 77     Resp 10/07/23 0834 16     Temp 10/07/23 0834 98.7 F (37.1 C)     Temp Source 10/07/23 0834 Oral     SpO2 10/07/23 0834 98 %     Weight --      Height --      Head Circumference --      Peak Flow --      Pain Score 10/07/23 0831 6     Pain Loc --      Pain Education --      Exclude from Growth Chart --    No data found.  Updated Vital Signs BP (!) 137/91 (BP Location: Left Arm)   Pulse 77   Temp 98.7 F (37.1 C) (Oral)   Resp 16   SpO2 98%   Visual Acuity Right Eye Distance:   Left Eye Distance:   Bilateral Distance:    Right Eye Near:   Left Eye Near:    Bilateral Near:     Physical Exam Vitals and nursing note reviewed.  Constitutional:      General: He is not in acute distress.    Appearance: He is not toxic-appearing.  HENT:     Right Ear: Tympanic membrane, ear canal and external ear normal.     Ears:     Comments: Has a small hair against the TM    Mouth/Throat:     Mouth: Mucous membranes are moist.      Comments: Cracked tooth, and abscess on R upper gum area Has poor dentition  Eyes:     General: No scleral icterus.    Conjunctiva/sclera: Conjunctivae normal.  Pulmonary:     Effort: Pulmonary effort is normal.  Musculoskeletal:        General: Normal range of motion.     Cervical back: Neck supple.  Lymphadenopathy:     Cervical: No cervical adenopathy.  Skin:    General: Skin is warm and dry.  Neurological:     Mental Status: He is alert and oriented to person, place, and time.     Gait: Gait normal.  Psychiatric:        Mood and Affect: Mood normal.        Behavior: Behavior normal.        Thought Content: Thought content normal.        Judgment: Judgment normal.      UC  Treatments / Results  Labs (all labs ordered are listed, but only abnormal results are displayed) Labs Reviewed - No data to display  EKG   Radiology No results found.  Procedures Procedures (including critical care time)  Medications Ordered in UC Medications - No data to display  Initial Impression / Assessment and Plan / UC Course  I have reviewed the triage vital signs and the nursing notes.  R up[per dental abscess  I placed him on Penicillin as noted Needs to try to get into a dentist. See instructions.    Final Clinical Impressions(s) / UC Diagnoses   Final diagnoses:  Dental abscess     Discharge Instructions      Check [facility] dental program to see if you could get a discount on dental care     ED Prescriptions     Medication Sig Dispense Auth. Provider   penicillin v potassium (VEETID) 500 MG tablet 2 bid x 10 days 40 tablet Rodriguez-Southworth, Nettie Elm, PA-C      PDMP not reviewed this encounter.   Garey Ham, [place] 10/07/23 [phone]

## 2023-10-07 NOTE — ED Triage Notes (Signed)
Patient presents to [facility] for evaluation of broken tooth to riht side of mouth x 1 year, continues to have swelling and cysts to right upper and has had to do antibiotics multiple times.  Patient states he was really bad the last time he needed antibiotics for it.  States he is trying to save up to get in with a dentist.

## 2023-11-13 ENCOUNTER — Ambulatory Visit: Payer: MEDICAID | Admitting: Cardiovascular Disease

## 2023-11-26 ENCOUNTER — Emergency Department: Payer: MEDICAID

## 2023-11-26 ENCOUNTER — Other Ambulatory Visit: Payer: Self-pay

## 2023-11-26 ENCOUNTER — Emergency Department
Admission: EM | Admit: 2023-11-26 | Discharge: 2023-11-26 | Disposition: A | Discharge status: Home / Self Care | Payer: MEDICAID | Attending: Emergency Medicine | Admitting: Emergency Medicine

## 2023-11-26 DIAGNOSIS — F840 Autistic disorder: Secondary | ICD-10-CM | POA: Insufficient documentation

## 2023-11-26 DIAGNOSIS — R55 Syncope and collapse: Secondary | ICD-10-CM | POA: Insufficient documentation

## 2023-11-26 DIAGNOSIS — R42 Dizziness and giddiness: Secondary | ICD-10-CM | POA: Diagnosis not present

## 2023-11-26 LAB — CBC
HCT: 41.8 % (ref 39.0–52.0)
Hemoglobin: 14.3 g/dL (ref 13.0–17.0)
MCH: 28.3 pg (ref 26.0–34.0)
MCHC: 34.2 g/dL (ref 30.0–36.0)
MCV: 82.8 fL (ref 80.0–100.0)
Platelets: 261 10*3/uL (ref 150–400)
RBC: 5.05 MIL/uL (ref 4.22–5.81)
RDW: 12.4 % (ref 11.5–15.5)
WBC: 8.3 10*3/uL (ref 4.0–10.5)
nRBC: 0 % (ref 0.0–0.2)

## 2023-11-26 LAB — RESP PANEL BY RT-PCR (RSV, FLU A&B, COVID)  RVPGX2
Influenza A by PCR: NEGATIVE
Influenza B by PCR: NEGATIVE
Resp Syncytial Virus by PCR: NEGATIVE
SARS Coronavirus 2 by RT PCR: NEGATIVE

## 2023-11-26 LAB — BASIC METABOLIC PANEL WITH GFR
Anion gap: 10 (ref 5–15)
BUN: 11 mg/dL (ref 6–20)
CO2: 22 mmol/L (ref 22–32)
Calcium: 8.9 mg/dL (ref 8.9–10.3)
Chloride: 106 mmol/L (ref 98–111)
Creatinine, Ser: 0.78 mg/dL (ref 0.61–1.24)
GFR, Estimated: >60 mL/min (ref >60)
Glucose, Bld: 114 mg/dL — ABNORMAL HIGH (ref 70–99)
Potassium: 3.4 mmol/L — ABNORMAL LOW (ref 3.5–5.1)
Sodium: 138 mmol/L (ref 135–145)

## 2023-11-26 LAB — URINALYSIS, ROUTINE W REFLEX MICROSCOPIC
Bilirubin Urine: NEGATIVE
Glucose, UA: NEGATIVE mg/dL
Hgb urine dipstick: NEGATIVE
Ketones, ur: NEGATIVE mg/dL
Leukocytes,Ua: NEGATIVE
Nitrite: NEGATIVE
Protein, ur: NEGATIVE mg/dL
Specific Gravity, Urine: 1.016 (ref 1.005–1.030)
pH: 7 (ref 5.0–8.0)

## 2023-11-26 LAB — D-DIMER, QUANTITATIVE: D-Dimer, Quant: <0.27 ug{FEU}/mL (ref 0.00–0.50)

## 2023-11-26 LAB — TROPONIN I (HIGH SENSITIVITY)
Troponin I (High Sensitivity): <2 ng/L (ref <18)
Troponin I (High Sensitivity): <2 ng/L (ref <18)

## 2023-11-26 NOTE — ED Triage Notes (Addendum)
 Pt comes with c/o loc. Pt states he hasn't felt good since yesterday. Pt states today he was on the phone and then stood up to go outside. Pt states he felt like the world was spinning. Pt stats next thing he woke up on the ground. Pt states he hit back of head. Pt states he looked at his watch and it said afib at 105.  Pt states he has been having heart palpitation and has appt with cards to get evaluated.   Pt states some left sided cp.

## 2023-11-26 NOTE — Discharge Instructions (Signed)
 Your workup was reassuring today.  Please follow-up with the cardiology team as planned.

## 2023-11-26 NOTE — ED Provider Notes (Signed)
 [facility] Provider Note    Event Date/Time   First MD Initiated Contact with Patient 11/26/23 2047     (approximate)   History   Loss of Consciousness   HPI [name] is a 33 y.o. male with history of anxiety, autism presenting today for syncope.  Patient was having palpitations over the past couple of days.  He then had an episode where he stood up and got lightheaded and he now woke up on the ground.  Has intermittent lightheaded episodes.  Currently being evaluated by cardiology outpatient for palpitations.  Otherwise denies shortness of breath, cough, congestion, nausea, vomiting.  No chest pain at this time.     Physical Exam   Triage Vital Signs: ED Triage Vitals  Encounter Vitals Group     BP 11/26/23 1753 (!) 129/90     Systolic BP Percentile --      Diastolic BP Percentile --      Pulse Rate 11/26/23 1753 92     Resp 11/26/23 1753 18     Temp 11/26/23 1753 98 F (36.7 C)     Temp Source 11/26/23 2105 Oral     SpO2 11/26/23 1753 100 %     Weight 11/26/23 1752 230 lb (104.3 kg)     Height 11/26/23 1752 5\' 11"  (1.803 m)     Head Circumference --      Peak Flow --      Pain Score 11/26/23 1751 6     Pain Loc --      Pain Education --      Exclude from Growth Chart --     Most recent vital signs: Vitals:   11/26/23 1753 11/26/23 2105  BP: (!) 129/90 112/70  Pulse: 92 77  Resp: 18 18  Temp: 98 F (36.7 C) 98.3 F (36.8 C)  SpO2: 100% 100%   Physical Exam: I have reviewed the vital signs and nursing notes. General: Awake, alert, no acute distress.  Nontoxic appearing. Head:  Atraumatic, normocephalic.   ENT:  EOM intact, PERRL. Oral mucosa is pink and moist with no lesions. Neck: Neck is supple with full range of motion, No meningeal signs. Cardiovascular:  RRR, No murmurs. Peripheral pulses palpable and equal bilaterally. Respiratory:  Symmetrical chest wall expansion.  No rhonchi, rales, or wheezes.  Good air  movement throughout.  No use of accessory muscles.   Musculoskeletal:  No cyanosis or edema. Moving extremities with full ROM Abdomen:  Soft, nontender, nondistended. Neuro:  GCS 15, moving all four extremities, interacting appropriately. Speech clear. Psych:  Calm, appropriate.   Skin:  Warm, dry, no rash.    ED Results / Procedures / Treatments   Labs (all labs ordered are listed, but only abnormal results are displayed) Labs Reviewed  BASIC METABOLIC PANEL WITH GFR - Abnormal; Notable for the following components:      Result Value   Potassium 3.4 (*)    Glucose, Bld 114 (*)    All other components within normal limits  URINALYSIS, ROUTINE W REFLEX MICROSCOPIC - Abnormal; Notable for the following components:   Color, Urine YELLOW (*)    APPearance CLEAR (*)    All other components within normal limits  RESP PANEL BY RT-PCR (RSV, FLU A&B, COVID)  RVPGX2  CBC  D-DIMER, QUANTITATIVE  CBG MONITORING, ED  TROPONIN I (HIGH SENSITIVITY)  TROPONIN I (HIGH SENSITIVITY)     EKG My EKG interpretation: Rate of 92, normal sinus rhythm, normal axis,  normal intervals.  No acute ST elevations or depressions   RADIOLOGY Independently interpreted chest x-ray with no acute pathology   PROCEDURES:  Critical Care performed: No  Procedures   MEDICATIONS ORDERED IN ED: Medications - No data to display   IMPRESSION / MDM / ASSESSMENT AND PLAN / ED COURSE  I reviewed the triage vital signs and the nursing notes.                              Differential diagnosis includes, but is not limited to, vasovagal syncope, dehydration, orthostatic hypotension, cardiac arrhythmia, electrolyte abnormality  Patient's presentation is most consistent with acute complicated illness / injury requiring diagnostic workup.  Patient is a 33 year old male with history of anxiety presenting today for palpitations and 1 episode of syncope.  Physical exam unremarkable and vital signs stable.   Laboratory workup with normal EKG and negative troponin x 2.  CBC and BMP unremarkable.  UA negative.  Negative for COVID, flu, RSV.  Chest x-ray negative.  D-dimer also negative indicating no concerns for PE.  Patient's workup is largely reassuring here today and suspect more vasovagal syncope and possible anxiety.  He also has follow-up visit with cardiology for ongoing evaluation of his palpitations.  Safer discharge and given strict return precautions.  The patient is on the cardiac monitor to evaluate for evidence of arrhythmia and/or significant heart rate changes.     FINAL CLINICAL IMPRESSION(S) / ED DIAGNOSES   Final diagnoses:  Syncope and collapse     Rx / DC Orders   ED Discharge Orders     None        Note:  This document was prepared using Dragon voice recognition software and may include unintentional dictation errors.   Janith Lima, MD 11/26/23 2249

## 2023-12-04 ENCOUNTER — Ambulatory Visit
Admission: EM | Admit: 2023-12-04 | Discharge: 2023-12-04 | Disposition: A | Discharge status: Home / Self Care | Payer: MEDICAID | Attending: Emergency Medicine | Admitting: Emergency Medicine

## 2023-12-04 DIAGNOSIS — H6691 Otitis media, unspecified, right ear: Secondary | ICD-10-CM

## 2023-12-04 MED ORDER — AMOXICILLIN 875 MG PO TABS: 875.0000 mg | ORAL_TABLET | Freq: Two times a day (BID) | ORAL | 0 refills | Status: AC

## 2023-12-04 NOTE — Discharge Instructions (Addendum)
 Take the amoxicillin as directed.  Follow up with your primary care provider if your symptoms are not improving.

## 2023-12-04 NOTE — ED Provider Notes (Signed)
 [name]    CSN: [id number] Arrival date & time: 12/04/23  1304      History   Chief Complaint No chief complaint on file.   HPI [name] is a 33 y.o. male.  Patient presents with 3-day history of right ear pain.  He had congestion and cough last week but these have improved.  He denies fever, ear drainage, shortness of breath.  No OTC medications taken today.  Patient was seen at [facility] ED on 11/26/2023 for syncope and collapse.  The history is provided by the patient and medical records.    Past Medical History:  Diagnosis Date   ADHD    Anxiety    Arthritis    knees   Asthma    sports induced.  no recent issues   Bipolar 1 disorder (HCC)    GERD (gastroesophageal reflux disease)    IBS (irritable bowel syndrome)    Overdose    PTSD (post-traumatic stress disorder)    Suicidal thoughts     Patient Active Problem List   Diagnosis Date Noted   Rectal bleeding 06/20/2023   Polyp of descending colon 06/20/2023   Gastroesophageal reflux disease 06/20/2023   Acute pain of right shoulder 10/26/2016   Substance induced mood disorder (HCC) 10/06/2016   Bipolar 1 disorder (HCC) 10/06/2016    Past Surgical History:  Procedure Laterality Date   APPENDECTOMY     COLONOSCOPY WITH PROPOFOL N/A 06/20/2023   Procedure: COLONOSCOPY WITH BIOPSY;  Surgeon: Midge Minium, MD;  Location: [facility];  Service: Endoscopy;  Laterality: N/A;   ESOPHAGOGASTRODUODENOSCOPY (EGD) WITH PROPOFOL N/A 06/20/2023   Procedure: ESOPHAGOGASTRODUODENOSCOPY (EGD) WITH PROPOFOL;  Surgeon: Midge Minium, MD;  Location: [facility];  Service: Endoscopy;  Laterality: N/A;   POLYPECTOMY  06/20/2023   Procedure: POLYPECTOMY;  Surgeon: Midge Minium, MD;  Location: [facility];  Service: Endoscopy;;       Home Medications    Prior to Admission medications   Medication Sig Start Date End Date Taking? Authorizing Provider  amoxicillin (AMOXIL) 875 MG  tablet Take 1 tablet (875 mg total) by mouth 2 (two) times daily for 10 days. 12/04/23 12/14/23 Yes Mickie Bail, NP  acetaminophen (TYLENOL) 500 MG tablet Take 500 mg by mouth every 6 (six) hours as needed for headache, fever, moderate pain or mild pain.    [provider]  budesonide-formoterol (SYMBICORT) 160-4.5 MCG/ACT inhaler Inhale 2 puffs into the lungs in the morning and at bedtime. 08/28/23   Assaker, [place], MD  busPIRone (BUSPAR) 15 MG tablet Take 1 tablet (15 mg total) by mouth 3 (three) times daily. 05/09/23     FLUoxetine (PROZAC) 20 MG capsule Take 1 capsule (20 mg total) by mouth every morning. 05/09/23     Multiple Vitamin (MULTIVITAMIN WITH MINERALS) TABS Take 1 tablet by mouth daily.    [provider]  omeprazole (PRILOSEC) 20 MG capsule Take 1 capsule (20 mg total) by mouth daily. 05/14/23 08/28/23  Corena Herter, MD  ondansetron (ZOFRAN-ODT) 4 MG disintegrating tablet Take 1 tablet (4 mg total) by mouth every 8 (eight) hours as needed for nausea or vomiting. 05/14/23   Mumma, Carollee Herter, MD  VENTOLIN HFA 108 (90 Base) MCG/ACT inhaler Inhale 2 puffs into the lungs every 4 (four) hours as needed. 08/15/23   [provider]    Family History Family History  Problem Relation Age of Onset   Depression Mother    Anxiety disorder Mother  Diabetes Father     Social History Social History   Tobacco Use   Smoking status: Former    Current packs/day: 0.00    Average packs/day: 1 pack/day for 3.0 years (3.0 ttl pk-yrs)    Types: Cigarettes    Start date: 11/21/2018    Quit date: 11/20/2021    Years since quitting: 2.0   Smokeless tobacco: Never   Tobacco comments:    vapes  Vaping Use   Vaping status: Never Used  Substance Use Topics   Alcohol use: No   Drug use: No     Allergies   Geodon [ziprasidone hydrochloride], Haldol [haloperidol lactate], Hydroxyzine, Minipress [prazosin], Risperidone, Trazodone and nefazodone, and  Ziprasidone   Review of Systems Review of Systems  Constitutional:  Negative for chills and fever.  HENT:  Positive for ear pain. Negative for ear discharge and sore throat.   Respiratory:  Negative for cough and shortness of breath.      Physical Exam Triage Vital Signs ED Triage Vitals [12/04/23 1328]  Encounter Vitals Group     BP 133/80     Systolic BP Percentile      Diastolic BP Percentile      Pulse Rate 92     Resp 18     Temp 98.1 F (36.7 C)     Temp src      SpO2 98 %     Weight      Height      Head Circumference      Peak Flow      Pain Score      Pain Loc      Pain Education      Exclude from Growth Chart    No data found.  Updated Vital Signs BP 133/80   Pulse 92   Temp 98.1 F (36.7 C)   Resp 18   SpO2 98%   Visual Acuity Right Eye Distance:   Left Eye Distance:   Bilateral Distance:    Right Eye Near:   Left Eye Near:    Bilateral Near:     Physical Exam Constitutional:      General: He is not in acute distress. HENT:     Right Ear: Tympanic membrane is erythematous.     Left Ear: Tympanic membrane normal.     Nose: Nose normal.     Mouth/Throat:     Mouth: Mucous membranes are moist.     Pharynx: Oropharynx is clear.  Cardiovascular:     Rate and Rhythm: Normal rate and regular rhythm.     Heart sounds: Normal heart sounds.  Pulmonary:     Effort: Pulmonary effort is normal. No respiratory distress.     Breath sounds: Normal breath sounds.  Neurological:     Mental Status: He is alert.      UC Treatments / Results  Labs (all labs ordered are listed, but only abnormal results are displayed) Labs Reviewed - No data to display  EKG   Radiology No results found.  Procedures Procedures (including critical care time)  Medications Ordered in UC Medications - No data to display  Initial Impression / Assessment and Plan / UC Course  I have reviewed the triage vital signs and the nursing notes.  Pertinent labs &  imaging results that were available during my care of the patient were reviewed by me and considered in my medical decision making (see chart for details).    Right otitis media.  Afebrile and vital  signs are stable.  Lungs are clear and O2 sat is 98% on room air.  Treating ear infection with amoxicillin.  Tylenol as needed.  Instructed him to follow-up with his PCP if he is not improving.  He agrees to plan of care.  Final Clinical Impressions(s) / UC Diagnoses   Final diagnoses:  Right otitis media, unspecified otitis media type     Discharge Instructions      Take the amoxicillin as directed.  Follow-up with your primary care provider if your symptoms are not improving.      ED Prescriptions     Medication Sig Dispense Auth. Provider   amoxicillin (AMOXIL) 875 MG tablet Take 1 tablet (875 mg total) by mouth 2 (two) times daily for 10 days. 20 tablet Wellington Half, NP      PDMP not reviewed this encounter.   Wellington Half, NP 12/04/23 1348

## 2023-12-04 NOTE — ED Triage Notes (Signed)
 Provider triage

## 2023-12-25 NOTE — Progress Notes (Unsigned)
 Cardiology Office Note  Date:  12/26/2023   ID:  [name], DOB Aug 03, 1991, MRN [id number]  PCP:  [facility]   Chief Complaint  Patient presents with   New Patient (Initial Visit)    Referred by Zelia Hickory for palpitations. Patient c/o dizziness, felt faint while checking in at the front desk, palpitations, shortness of breath with racing heart beats.     HPI:  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  Has intermittent lightheaded episodes.  ER workup negative Felt by the ER to be vasovagal syncope and possible anxiety  Reports that he stays hydrated Main complaint is periodic palpitations Seems to come and go, drinks soda and energy drinks  Orthostatics done in the office today, no significant drop in pressure with standing or standing after 3 minutes, no significant change in heart rate running 80-90  Prior echocardiogram April 2022 at [facility] essentially normal study  EKG personally reviewed by myself on todays visit EKG Interpretation Date/Time:  Tuesday Dec 26 2023 15:56:43 EDT Ventricular Rate:  84 PR Interval:  150 QRS Duration:  106 QT Interval:  398 QTC Calculation: 470 R Axis:   11  Text Interpretation: Normal sinus rhythm Normal ECG When compared with ECG of 26-Dec-2023 15:55, No significant change was found Confirmed by Belva Boyden [phone]) on 12/26/2023 4:04:53 PM    PMH:   has a past medical history of ADHD, Anxiety, Arthritis, Asthma, Bipolar 1 disorder (HCC), GERD  (gastroesophageal reflux disease), IBS (irritable bowel syndrome), Overdose, PTSD (post-traumatic stress disorder), and Suicidal thoughts.  PSH:    Past Surgical History:  Procedure Laterality Date   APPENDECTOMY     COLONOSCOPY WITH PROPOFOL  N/A 06/20/2023   Procedure: COLONOSCOPY WITH BIOPSY;  Surgeon: Marnee Sink, MD;  Location: [facility];  Service: Endoscopy;  Laterality: N/A;   ESOPHAGOGASTRODUODENOSCOPY (EGD) WITH PROPOFOL  N/A 06/20/2023   Procedure: ESOPHAGOGASTRODUODENOSCOPY (EGD) WITH PROPOFOL ;  Surgeon: Marnee Sink, MD;  Location: [facility];  Service: Endoscopy;  Laterality: N/A;   POLYPECTOMY  06/20/2023   Procedure: POLYPECTOMY;  Surgeon: Marnee Sink, MD;  Location: [facility];  Service: Endoscopy;;    Current Outpatient Medications  Medication Sig Dispense Refill   acetaminophen  (TYLENOL ) 500 MG tablet Take 500 mg by mouth every 6 (six) hours as needed for headache, fever, moderate pain or mild pain.     busPIRone  (BUSPAR ) 15 MG tablet Take 1 tablet (15 mg total) by mouth 3 (three) times daily. 90 tablet 1   FLUoxetine  (PROZAC ) 20 MG capsule Take 1 capsule (20 mg total) by mouth every morning. 30 capsule 1   Multiple Vitamin (MULTIVITAMIN WITH MINERALS) TABS Take 1 tablet by mouth daily.     omeprazole  (PRILOSEC) 20 MG capsule Take 1 capsule (20 mg total) by mouth daily. 30 capsule 2   VENTOLIN  HFA 108 (90 Base) MCG/ACT inhaler Inhale 2 puffs into the lungs every 4 (four) hours as needed.     budesonide -formoterol  (SYMBICORT ) 160-4.5 MCG/ACT inhaler Inhale 2 puffs into the lungs in the morning and  at bedtime. (Patient not taking: Reported on 12/26/2023) 1 each 12   ondansetron  (ZOFRAN -ODT) 4 MG disintegrating tablet Take 1 tablet (4 mg total) by mouth every 8 (eight) hours as needed for nausea or vomiting. (Patient not taking: Reported on 12/26/2023) 30 tablet 0   No current facility-administered medications for this visit.    Allergies:   Geodon  [ziprasidone hydrochloride], Haldol [haloperidol lactate], Hydroxyzine , Minipress [prazosin], Risperidone, Trazodone and nefazodone, and Ziprasidone   Social History:  The patient  reports that he quit smoking about 2 years ago. His smoking use included cigarettes. He started smoking about 5 years ago. He has a 3 pack-year smoking history. He has never used smokeless tobacco. He reports that he does not drink alcohol and does not use drugs.   Family History:   family history includes Anemia in his father; Anxiety disorder in his mother; Depression in his mother; Diabetes in his father; Heart attack in his maternal grandfather; Heart attack (age of onset: 80) in his father; Heart disease in his father and maternal grandfather; Heart failure in his maternal grandfather; Hyperlipidemia in his father; Iron deficiency in his father.   Review of Systems: Review of Systems  Constitutional: Negative.   HENT: Negative.    Respiratory: Negative.    Cardiovascular: Negative.   Gastrointestinal: Negative.   Musculoskeletal: Negative.   Neurological: Negative.   Psychiatric/Behavioral: Negative.    All other systems reviewed and are negative.  PHYSICAL EXAM: VS:  BP 120/80 (BP Location: Right Arm, Patient Position: Sitting, Cuff Size: Normal)   Pulse 84   Ht 5\' 10"  (1.778 m)   Wt 245 lb (111.1 kg)   SpO2 98%   BMI 35.15 kg/m  , BMI Body mass index is 35.15 kg/m. GEN: Well nourished, well developed, in no acute distress HEENT: normal Neck: no JVD, carotid bruits, or masses Cardiac: RRR; no murmurs, rubs, or gallops,no edema  Respiratory:  clear to auscultation bilaterally, normal work of breathing GI: soft, nontender, nondistended, + BS MS: no deformity or atrophy Skin: warm and dry, no rash Neuro:  Strength and sensation are intact Psych: euthymic mood, full affect  Recent Labs: 05/14/2023: ALT 29 11/26/2023: BUN 11; Creatinine, Ser 0.78; Hemoglobin 14.3; Platelets 261; Potassium 3.4; Sodium  138   Lipid Panel Lab Results  Component Value Date   CHOL  11/12/2008    123        ATP III CLASSIFICATION:  <200     mg/dL   Desirable  440-102  mg/dL   Borderline High  >=725    mg/dL   High          HDL 34 (L) 11/12/2008   LDLCALC  11/12/2008    77        Total Cholesterol/HDL:CHD Risk Coronary Heart Disease Risk Table                     Men   Women  1/2 Average Risk   3.4   3.3  Average Risk       5.0   4.4  2 X Average Risk   9.6   7.1  3 X Average Risk  23.4   11.0        Use the calculated Patient Ratio above and the CHD Risk Table to determine the patient's CHD Risk.        ATP III CLASSIFICATION (LDL):  <100     mg/dL   Optimal  366-440  mg/dL   Near  or Above                    Optimal  130-159  mg/dL   Borderline  657-846  mg/dL   High  >962     mg/dL   Very High   TRIG 58 95/28/4132     Wt Readings from Last 3 Encounters:  12/26/23 245 lb (111.1 kg)  11/26/23 230 lb (104.3 kg)  08/28/23 239 lb (108.4 kg)     ASSESSMENT AND PLAN:  Problem List Items Addressed This Visit   None Visit Diagnoses       Palpitations    -  Primary   Relevant Orders   EKG 12-Lead (Completed)     Syncope and collapse       Relevant Orders   EKG 12-Lead (Completed)      Paroxysmal tachycardia/palpitations Etiology unclear, reports also has history of anxiety Recommend Zio monitor for further evaluation  Dizziness/orthostasis Orthostatics negative on today's visit Recommend he stay hydrated Prior echocardiogram April 2022 essentially normal study No additional testing needed at this time  Smoker We have encouraged him to continue to work on weaning his cigarettes and smoking cessation. He will continue to work on this and does not want any assistance with chantix.    Patient seen in consultation for Zelia Hickory and will be referred back to his office for ongoing care of the issues detailed above  Signed, Juanda Noon, M.D., Ph.D. [facility] [place], [place] [phone]

## 2023-12-26 ENCOUNTER — Encounter: Payer: Self-pay | Admitting: Cardiovascular Disease

## 2023-12-26 ENCOUNTER — Ambulatory Visit: Payer: MEDICAID | Attending: Cardiovascular Disease | Admitting: Cardiovascular Disease

## 2023-12-26 ENCOUNTER — Ambulatory Visit: Payer: MEDICAID | Attending: Cardiovascular Disease

## 2023-12-26 VITALS — BP 120/80 | HR 84 | Ht 70.0 in | Wt 245.0 lb

## 2023-12-26 DIAGNOSIS — R55 Syncope and collapse: Secondary | ICD-10-CM | POA: Diagnosis present

## 2023-12-26 DIAGNOSIS — R002 Palpitations: Secondary | ICD-10-CM | POA: Diagnosis present

## 2023-12-26 NOTE — Patient Instructions (Addendum)
 Medication Instructions:  No changes  If you need a refill on your cardiac medications before your next appointment, please call your pharmacy.   Lab work: No new labs needed  Testing/Procedures: Zio monitor for palpitations,tachycardia   ZIO XT- Long Term Monitor Instructions  Your physician has requested you wear a ZIO patch monitor for 14 days.  This is a single patch monitor. [employer] supplies one patch monitor per enrollment. Additional stickers are not available. Please do not apply patch if you will be having a Nuclear Stress Test,  Echocardiogram, Cardiac CT, MRI, or Chest Xray during the period you would be wearing the  monitor. The patch cannot be worn during these tests. You cannot remove and re-apply the  ZIO XT patch monitor.  Your ZIO patch monitor will be mailed 3 day USPS to your address on file. It may take 3-5 days  to receive your monitor after you have been enrolled.  Once you have received your monitor, please review the enclosed instructions. Your monitor  has already been registered assigning a specific monitor serial # to you.  Billing and Patient Assistance Program Information  We have supplied [employer] with any of your insurance information on file for billing purposes. [employer] offers a sliding scale Patient Assistance Program for patients that do not have  insurance, or whose insurance does not completely cover the cost of the ZIO monitor.  You must apply for the Patient Assistance Program to qualify for this discounted rate.  To apply, please call [employer] at [phone], select option 4, select option 2, ask to apply for  Patient Assistance Program. Sanna Crystal will ask your household income, and how many people  are in your household. They will quote your out-of-pocket cost based on that information.  [employer] will also be able to set up a 3-month, interest-free payment plan if needed.  Applying the monitor   Shave hair from upper left chest.  Hold  abrader disc by orange tab. Rub abrader in 40 strokes over the upper left chest as  indicated in your monitor instructions.  Clean area with 4 enclosed alcohol pads. Let dry.  Apply patch as indicated in monitor instructions. Patch will be placed under collarbone on left  side of chest with arrow pointing upward.  Rub patch adhesive wings for 2 minutes. Remove white label marked "1". Remove the white  label marked "2". Rub patch adhesive wings for 2 additional minutes.  While looking in a mirror, press and release button in center of patch. A small green light will  flash 3-4 times. This will be your only indicator that the monitor has been turned on.  Do not shower for the first 24 hours. You may shower after the first 24 hours.  Press the button if you feel a symptom. You will hear a small click. Record Date, Time and  Symptom in the Patient Logbook.  When you are ready to remove the patch, follow instructions on the last 2 pages of Patient  Logbook. Stick patch monitor onto the last page of Patient Logbook.  Place Patient Logbook in the blue and white box. Use locking tab on box and tape box closed  securely. The blue and white box has prepaid postage on it. Please place it in the mailbox as  soon as possible. Your physician should have your test results approximately 7 days after the  monitor has been mailed back to [facility].  Call [facility] Customer Care at [phone] if you have questions regarding  your ZIO XT patch monitor. Call them immediately if you see an orange light blinking on your  monitor.  If your monitor falls off in less than 4 days, contact our Monitor department at [phone].  If your monitor becomes loose or falls off after 4 days call [employer] at [phone] for  suggestions on securing your monitor   Follow-Up: At [facility], you and your health needs are our priority.  As part of our continuing mission to provide you with exceptional heart  care, we have created designated Provider Care Teams.  These Care Teams include your primary Cardiologist (physician) and Advanced Practice Providers (APPs -  Physician Assistants and Nurse Practitioners) who all work together to provide you with the care you need, when you need it.  You will need a follow up appointment as needed  Providers on your designated Care Team:   Laneta Pintos, NP Varney Gentleman, PA-C Cadence Gennaro Khat, [place]  COVID-19 Vaccine Information can be found at: [email or web address] For questions related to vaccine distribution or appointments, please email [email or web address] or call [phone].

## 2024-02-04 DIAGNOSIS — R002 Palpitations: Secondary | ICD-10-CM | POA: Diagnosis not present

## 2024-02-06 ENCOUNTER — Ambulatory Visit: Payer: Self-pay | Admitting: Cardiovascular Disease

## 2024-02-06 ENCOUNTER — Encounter: Payer: Self-pay | Admitting: Emergency Medicine

## 2024-09-08 ENCOUNTER — Emergency Department
Admission: EM | Admit: 2024-09-08 | Discharge: 2024-09-08 | Disposition: A | Discharge status: Home / Self Care | Attending: Emergency Medicine | Admitting: Emergency Medicine

## 2024-09-08 ENCOUNTER — Other Ambulatory Visit: Payer: Self-pay

## 2024-09-08 ENCOUNTER — Emergency Department

## 2024-09-08 DIAGNOSIS — G40909 Epilepsy, unspecified, not intractable, without status epilepticus: Secondary | ICD-10-CM | POA: Diagnosis not present

## 2024-09-08 DIAGNOSIS — K625 Hemorrhage of anus and rectum: Secondary | ICD-10-CM | POA: Diagnosis not present

## 2024-09-08 DIAGNOSIS — R519 Headache, unspecified: Secondary | ICD-10-CM | POA: Diagnosis present

## 2024-09-08 DIAGNOSIS — G43809 Other migraine, not intractable, without status migrainosus: Secondary | ICD-10-CM

## 2024-09-08 LAB — CBC
HCT: 43 % (ref 39.0–52.0)
Hemoglobin: 14.2 g/dL (ref 13.0–17.0)
MCH: 27.3 pg (ref 26.0–34.0)
MCHC: 33 g/dL (ref 30.0–36.0)
MCV: 82.7 fL (ref 80.0–100.0)
Platelets: 351 K/uL (ref 150–400)
RBC: 5.2 MIL/uL (ref 4.22–5.81)
RDW: 13 % (ref 11.5–15.5)
WBC: 13.7 K/uL — ABNORMAL HIGH (ref 4.0–10.5)
nRBC: 0 % (ref 0.0–0.2)

## 2024-09-08 LAB — COMPREHENSIVE METABOLIC PANEL WITH GFR
ALT: 26 U/L (ref 0–44)
AST: 19 U/L (ref 15–41)
Albumin: 4.8 g/dL (ref 3.5–5.0)
Alkaline Phosphatase: 135 U/L — ABNORMAL HIGH (ref 38–126)
Anion gap: 12 (ref 5–15)
BUN: 10 mg/dL (ref 6–20)
CO2: 24 mmol/L (ref 22–32)
Calcium: 9.1 mg/dL (ref 8.9–10.3)
Chloride: 101 mmol/L (ref 98–111)
Creatinine, Ser: 0.79 mg/dL (ref 0.61–1.24)
GFR, Estimated: >60 mL/min
Glucose, Bld: 167 mg/dL — ABNORMAL HIGH (ref 70–99)
Potassium: 4.2 mmol/L (ref 3.5–5.1)
Sodium: 137 mmol/L (ref 135–145)
Total Bilirubin: <0.2 mg/dL (ref 0.0–1.2)
Total Protein: 7.5 g/dL (ref 6.5–8.1)

## 2024-09-08 LAB — TYPE AND SCREEN
ABO/RH(D): A POS
Antibody Screen: NEGATIVE

## 2024-09-08 MED ORDER — GADOBUTROL 1 MMOL/ML IV SOLN
10.0000 mL | Freq: Once | INTRAVENOUS | Status: AC | PRN
  Administered 2024-09-08: 10 mL via INTRAVENOUS

## 2024-09-08 MED ORDER — IOHEXOL 350 MG/ML SOLN
100.0000 mL | Freq: Once | INTRAVENOUS | Status: AC | PRN
  Administered 2024-09-08: 100 mL via INTRAVENOUS

## 2024-09-08 MED ORDER — SODIUM CHLORIDE 0.9 % IV BOLUS
1000.0000 mL | Freq: Once | INTRAVENOUS | Status: AC
  Administered 2024-09-08: 1000 mL via INTRAVENOUS

## 2024-09-08 MED ORDER — KETOROLAC TROMETHAMINE 15 MG/ML IJ SOLN
15.0000 mg | Freq: Once | INTRAMUSCULAR | Status: AC
  Administered 2024-09-08: 15 mg via INTRAVENOUS
  Filled 2024-09-08: qty 1

## 2024-09-08 MED ORDER — ONDANSETRON HCL 4 MG/2ML IJ SOLN
4.0000 mg | Freq: Once | INTRAMUSCULAR | Status: AC
  Administered 2024-09-08: 4 mg via INTRAVENOUS
  Filled 2024-09-08: qty 2

## 2024-09-08 MED ORDER — METOCLOPRAMIDE HCL 5 MG/ML IJ SOLN
10.0000 mg | Freq: Once | INTRAMUSCULAR | Status: AC
  Administered 2024-09-08: 10 mg via INTRAVENOUS
  Filled 2024-09-08: qty 2

## 2024-09-08 MED ORDER — DIPHENHYDRAMINE HCL 50 MG/ML IJ SOLN
25.0000 mg | Freq: Once | INTRAMUSCULAR | Status: AC
  Administered 2024-09-08: 25 mg via INTRAVENOUS
  Filled 2024-09-08: qty 1
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
## 2024-09-08 NOTE — ED Triage Notes (Signed)
 Pt to ED for bilateral frontal headache that started at 12pm, then it felt like my head dropped and the room was spinning while watching TV. States headache is burning, it comes in waves, like pressure is building, like fire ants. Then pt urinated on himself. Pt has also been on prednisone  and flexiril for last week for TMJ pain.  When asked about vision changes, states that since the HA started has had occasional white flashes of light in peripheral vision.  Headache is worse when he stands up or leaning forward. Pt states the pain comes in waves.   At the end of triage pt also told this RN that has been having bright red rectal bleeding with BM since 1 week.   Pt is ambulatory with steady gait. Pt took tylenol  and motrin  today at 230pm. No focal deficits, no arm drift, no upper limb ataxia, and face is symmetrical.   Sending blue and red top with labs.

## 2024-09-08 NOTE — Discharge Instructions (Addendum)
 You should follow-up with neurology to discuss this further and follow-up with [facility] to discuss this further if you develop any flashes of lights, curtain over your eye or any other concerns please return to the ER for repeat evaluation.  However I do suspect that this could have been a complex migraine.  You can take Tylenol  1 g every 8 hours and ibuprofen  600 every 8 hours for the next 1 week to help with symptoms.  Take Zofran  to help with any nausea.  If you develop any issues with urination you should return to the ER for repeat evaluation.

## 2024-09-08 NOTE — ED Notes (Signed)
 Bladder scan preformed on pt after instruction by Dr. Ernest. Bladder scan number one stated . Bladder scan number two stated >16mL.
